# Patient Record
Sex: Female | Born: 1950
Health system: Southern US, Community
[De-identification: ages and names within clinical notes are randomized; demographics above are authoritative.]

## PROBLEM LIST (undated history)

## (undated) DIAGNOSIS — J189 Pneumonia, unspecified organism: Secondary | ICD-10-CM

## (undated) DIAGNOSIS — E041 Nontoxic single thyroid nodule: Secondary | ICD-10-CM

---

## 2014-07-14 DIAGNOSIS — Z6829 Body mass index (BMI) 29.0-29.9, adult: Secondary | ICD-10-CM | POA: Diagnosis not present

## 2014-07-14 DIAGNOSIS — Z1231 Encounter for screening mammogram for malignant neoplasm of breast: Secondary | ICD-10-CM | POA: Diagnosis not present

## 2014-07-14 DIAGNOSIS — F4320 Adjustment disorder, unspecified: Secondary | ICD-10-CM | POA: Diagnosis not present

## 2014-07-14 DIAGNOSIS — Z1211 Encounter for screening for malignant neoplasm of colon: Secondary | ICD-10-CM | POA: Diagnosis not present

## 2014-07-14 DIAGNOSIS — Z01411 Encounter for gynecological examination (general) (routine) with abnormal findings: Secondary | ICD-10-CM | POA: Diagnosis not present

## 2014-07-17 ENCOUNTER — Emergency Department (HOSPITAL_COMMUNITY)
Admission: EM | Admit: 2014-07-17 | Discharge: 2014-07-17 | Disposition: A | Discharge status: Home / Self Care | Payer: Medicare Other | Attending: Emergency Medicine | Admitting: Emergency Medicine

## 2014-07-17 ENCOUNTER — Emergency Department (HOSPITAL_COMMUNITY): Payer: Medicare Other

## 2014-07-17 ENCOUNTER — Encounter (HOSPITAL_COMMUNITY): Payer: Self-pay | Admitting: Emergency Medicine

## 2014-07-17 ENCOUNTER — Ambulatory Visit (INDEPENDENT_AMBULATORY_CARE_PROVIDER_SITE_OTHER): Payer: Medicare Other | Admitting: Family Medicine

## 2014-07-17 VITALS — BP 138/78 | HR 58 | Temp 97.9°F | Resp 20 | Ht 64.0 in | Wt 168.0 lb

## 2014-07-17 DIAGNOSIS — R061 Stridor: Secondary | ICD-10-CM | POA: Diagnosis not present

## 2014-07-17 DIAGNOSIS — R059 Cough, unspecified: Secondary | ICD-10-CM

## 2014-07-17 DIAGNOSIS — Z79899 Other long term (current) drug therapy: Secondary | ICD-10-CM | POA: Diagnosis not present

## 2014-07-17 DIAGNOSIS — R05 Cough: Secondary | ICD-10-CM

## 2014-07-17 DIAGNOSIS — E042 Nontoxic multinodular goiter: Secondary | ICD-10-CM | POA: Diagnosis not present

## 2014-07-17 DIAGNOSIS — R062 Wheezing: Secondary | ICD-10-CM | POA: Diagnosis not present

## 2014-07-17 DIAGNOSIS — R0602 Shortness of breath: Secondary | ICD-10-CM | POA: Diagnosis not present

## 2014-07-17 LAB — CBC WITH DIFFERENTIAL/PLATELET
BASOS PCT: 0 % (ref 0–1)
Basophils Absolute: 0 10*3/uL (ref 0.0–0.1)
Eosinophils Absolute: 0.1 10*3/uL (ref 0.0–0.7)
Eosinophils Relative: 2 % (ref 0–5)
HCT: 43.9 % (ref 36.0–46.0)
Hemoglobin: 15 g/dL (ref 12.0–15.0)
Lymphocytes Relative: 26 % (ref 12–46)
Lymphs Abs: 1.8 10*3/uL (ref 0.7–4.0)
MCH: 30.3 pg (ref 26.0–34.0)
MCHC: 34.2 g/dL (ref 30.0–36.0)
MCV: 88.7 fL (ref 78.0–100.0)
MONO ABS: 0.5 10*3/uL (ref 0.1–1.0)
Monocytes Relative: 7 % (ref 3–12)
NEUTROS ABS: 4.5 10*3/uL (ref 1.7–7.7)
Neutrophils Relative %: 65 % (ref 43–77)
Platelets: 170 10*3/uL (ref 150–400)
RBC: 4.95 MIL/uL (ref 3.87–5.11)
RDW: 12.9 % (ref 11.5–15.5)
WBC: 6.9 10*3/uL (ref 4.0–10.5)

## 2014-07-17 LAB — COMPREHENSIVE METABOLIC PANEL
ALBUMIN: 4.1 g/dL (ref 3.5–5.0)
ALT: 32 U/L (ref 14–54)
AST: 25 U/L (ref 15–41)
Alkaline Phosphatase: 77 U/L (ref 38–126)
Anion gap: 8 (ref 5–15)
BUN: 15 mg/dL (ref 6–20)
CALCIUM: 9.5 mg/dL (ref 8.9–10.3)
CHLORIDE: 106 mmol/L (ref 101–111)
CO2: 27 mmol/L (ref 22–32)
CREATININE: 0.92 mg/dL (ref 0.44–1.00)
GFR calc Af Amer: >60 mL/min (ref >60)
GFR calc non Af Amer: >60 mL/min (ref >60)
GLUCOSE: 104 mg/dL — AB (ref 65–99)
Potassium: 3.6 mmol/L (ref 3.5–5.1)
Sodium: 141 mmol/L (ref 135–145)
Total Bilirubin: 0.5 mg/dL (ref 0.3–1.2)
Total Protein: 6.9 g/dL (ref 6.5–8.1)

## 2014-07-17 LAB — TSH: TSH: 1.507 u[IU]/mL (ref 0.350–4.500)

## 2014-07-17 LAB — T4, FREE: FREE T4: 0.95 ng/dL (ref 0.61–1.12)

## 2014-07-17 MED ORDER — IOHEXOL 300 MG/ML  SOLN
75.0000 mL | Freq: Once | INTRAMUSCULAR | Status: AC | PRN
  Administered 2014-07-17: 75 mL via INTRAVENOUS

## 2014-07-17 NOTE — ED Notes (Signed)
Pt via [facility] from [facility] with c/o cough starting last night, midnight SOB and wheezing, 3 am woke up feeling congested and took a shower.  Pt feels like coughing increased throat tightness.  Auditory wheezing noted, lungs clear.  Denies CP, fever, N/V/D.  Pt reports the same approx 3 weeks ago that resolved quickly and was mild.  Pt alert and oriented.

## 2014-07-17 NOTE — Progress Notes (Signed)
Subjective:  This chart was scribed for Charlene Ray, MD by [facility], medical scribe at [facility].The patient was seen in exam room 11 and the patient's care was started at 2:34 PM.   Patient ID: Charlene Jacobs, female    DOB: 1950/07/28, 64 y.o.   MRN: [id number] Chief Complaint  Patient presents with  . Cough    X yesterday  . Wheezing    X last night  . SOB    X last night   HPI HPI Comments: Charlene Jacobs is a 64 y.o. female who presents to [facility] complaining of a shortness of breath, onset last night around 2:00 AM. She has a tightness in her throat but not chest. Symptoms worsen with activity. When she woke up this morning she took a shower did help, however symptoms have worsened in the past two to three hours. Yesterday she had a cough and sore throat all day, but this has improved. This is the third episode in the last couple months, the previous two episodes cleared within a few days. She has used her husbands antibiotics during previous episodes. She denies fever, chest pain, lightheadedness, wheezing, and dizziness.  There are no active problems to display for this patient.  History reviewed. No pertinent past medical history. History reviewed. No pertinent past surgical history. No Known Allergies Prior to Admission medications   Not on File   History   Social History  . Marital Status: Married    Spouse Name: N/A  . Number of Children: N/A  . Years of Education: N/A   Occupational History  . Not on file.   Social History Main Topics  . Smoking status: Never Smoker   . Smokeless tobacco: Not on file  . Alcohol Use: 0.0 oz/week    0 Standard drinks or equivalent per week  . Drug Use: No  . Sexual Activity: Not on file   Other Topics Concern  . Not on file   Social History Narrative  . No narrative on file   Review of Systems  Constitutional: Negative for fever.  HENT: Positive for sore  throat.   Respiratory: Positive for cough, shortness of breath and stridor. Negative for wheezing.   Cardiovascular: Negative for chest pain.  Neurological: Negative for dizziness and light-headedness.      Objective:  BP 138/78 mmHg  Pulse 58  Temp(Src) 97.9 F (36.6 C) (Oral)  Resp 20  Ht 5\' 4"  (1.626 m)  Wt 168 lb (76.204 kg)  BMI 28.82 kg/m2  SpO2 98% Physical Exam  Constitutional: She is oriented to person, place, and time. She appears well-developed and well-nourished. No distress.  HENT:  Head: Normocephalic and atraumatic.  Eyes: Pupils are equal, round, and reactive to light.  Neck: Normal range of motion.  Cardiovascular: Normal rate and regular rhythm.   Pulmonary/Chest: Effort normal. No respiratory distress.  Inspiratory and expiratory stridor that intermittently clears. Worse with activity when moving from room 11 to 7  Musculoskeletal: Normal range of motion.  Neurological: She is alert and oriented to person, place, and time.  Skin: Skin is warm and dry.  Psychiatric: She has a normal mood and affect. Her behavior is normal.  Nursing note and vitals reviewed. 2:56 PM report given to EMS, transfer of care and charge nurse was advised.     Assessment & Plan:   [name] is a 64 y.o. female Stridor  Cough  Acute onset  of stridor today, initially some improvement with warm shower, then recurrence and reported worsening past few hours.  stridorous with shortened sentences on initial exam. Concerning for central airway obstruction. reported similar sx's twice before in past 2 months, but has not been evaluated prior. Currently alert and oriented, comfortable appearing, not tachypneic at rest, and O2 sat ok.   -O2NC at 2 liters. EMS called for transport for further eval in ER, charge nurse advised.   No orders of the defined types were placed in this encounter.   Patient Instructions  If follow up needed after emergency room, you can return here for  follow up.

## 2014-07-17 NOTE — Patient Instructions (Signed)
If follow up needed after emergency room, you can return here for follow up.

## 2014-07-17 NOTE — ED Provider Notes (Signed)
CSN: [id number]     Arrival date & time 07/17/14  1522 History   First MD Initiated Contact with Patient 07/17/14 1525     Chief Complaint  Patient presents with  . Shortness of Breath  . Wheezing     (Consider location/radiation/quality/duration/timing/severity/associated sxs/prior Treatment) HPI  History reviewed. No pertinent past medical history. History reviewed. No pertinent past surgical history. Family History  Problem Relation Age of Onset  . Heart disease Mother   . Stroke Mother   . Cancer Brother    History  Substance Use Topics  . Smoking status: Never Smoker   . Smokeless tobacco: Never Used  . Alcohol Use: 1.2 oz/week    2 Glasses of wine per week   OB History    No data available     Review of Systems    Allergies  Review of patient's allergies indicates no known allergies.  Home Medications   Prior to Admission medications   Medication Sig Start Date End Date Taking? Authorizing Provider  b complex vitamins tablet Take 1 tablet by mouth once a week.   Yes Historical Provider, MD  ibuprofen (ADVIL,MOTRIN) 200 MG tablet Take 200 mg by mouth daily as needed (joint pain).   Yes Historical Provider, MD   BP 153/77 mmHg  Pulse 63  Temp(Src) 97.9 F (36.6 C) (Oral)  Resp 14  SpO2 100% Physical Exam  ED Course  Procedures (including critical care time) Labs Review Labs Reviewed  COMPREHENSIVE METABOLIC PANEL - Abnormal; Notable for the following:    Glucose, Bld 104 (*)    All other components within normal limits  CBC WITH DIFFERENTIAL/PLATELET  TSH  T4, FREE    Imaging Review Dg Chest 2 View  07/17/2014   CLINICAL DATA:  Shortness of breath.  Wheezing.  EXAM: CHEST  2 VIEW  COMPARISON:  None.  FINDINGS: The heart size and mediastinal contours are within normal limits. Both lungs are clear. The visualized skeletal structures are unremarkable.  IMPRESSION: No active cardiopulmonary disease.   Electronically Signed   By: Van Clines  M.D.   On: 07/17/2014 16:38   Ct Soft Tissue Neck W Contrast  07/17/2014   CLINICAL DATA:  Cough. Throat tightness. Left neck palpable nodule.  EXAM: CT NECK WITH CONTRAST  TECHNIQUE: Multidetector CT imaging of the neck was performed using the standard protocol following the bolus administration of intravenous contrast.  CONTRAST:  66mL OMNIPAQUE IOHEXOL 300 MG/ML  SOLN  COMPARISON:  None.  FINDINGS: Epiglottis, aryepiglottic folds, and glottic structures normal. Small hypodense nodules are present in the right thyroid lobe and the left thyroid lobe is diffusely enlarged and questionably hyperdense.  No pathologic adenopathy in the neck is identified. There is chronic left maxillary sinusitis and some frothy fluid in the left sphenoid sinus.  Parapharyngeal spaces appear normal. Mild biapical pleural parenchymal scarring.  No significant tonsillar enlargement.  Mild facet arthropathy on the right at C3-4. Calcified disc bulge at C5-6 without definite impingement.  IMPRESSION: 1. Small hypodense nodules in the right thyroid gland, with generalized enlargement of the left thyroid lobe and possibly a indistinct high density lesion anteriorly in the left thyroid lobe. Consider further evaluation with thyroid ultrasound (can be performed in the non-emergent setting). If patient is clinically hyperthyroid, consider nuclear medicine thyroid uptake and scan. 2. Mild acute left sphenoid sinusitis. Chronic left maxillary sinusitis. 3. Minimal cervical spondylosis and degenerative disc disease.   Electronically Signed   By: Cindra Eves.D.  On: 07/17/2014 18:28     EKG Interpretation   Date/Time:  Sunday July 17 2014 15:27:03 EDT Ventricular Rate:  63 PR Interval:  171 QRS Duration: 74 QT Interval:  418 QTC Calculation: 428 R Axis:   32 Text Interpretation:  Sinus rhythm Low voltage, precordial leads No  previous tracing Confirmed by Ashok Cordia  MD, Lennette Bihari ([id number]) on 07/17/2014  3:40:26 PM      MDM    64 year old female in no pertinent past medical history reports 2 weeks of cough and 1 day of trouble breathing with throat tightness. Rest of the history and exam as above. On presentation patient audibly wheezing with mild stridor. Mass palpable in the left throat. No evidence of PTA, strep throat. Chest x-ray unremarkable. CT of the neck revealed right thyroid nodule with no evidence to pinpoint the source of her stridor. Labs unremarkable. On reevaluation the patient's symptoms had completely resolved. She was satting well and normal work of breathing. She is in good condition is stable for discharge with strict return precautions. She is to follow-up with her primary care provider for further workup of the thyroid nodule.  She was seen in conjunction with Dr. Ashok Cordia.  Final diagnoses:  Wheezing  Stridor        Addison Lank, MD 07/18/14 [place], MD 07/18/14 1122

## 2014-07-17 NOTE — Discharge Instructions (Signed)
Please follow-up with your primary care provider for further workup of CT findings: Small hypodense nodules in the right thyroid gland, with generalized enlargement of the left thyroid lobe and possibly a indistinct high density lesion anteriorly in the left thyroid lobe. Consider further evaluation with thyroid ultrasound (can be performed in the non-emergent setting). If patient is clinically hyperthyroid, consider nuclear medicine thyroid uptake and scan.  Stridor Stridor is an abnormal, usually high-pitched sound made while breathing. It is the result of an airway that is partly blocked. Stridor occurs more often in children than in adults because children have smaller airways. Many different things can cause stridor. It might be an infection, a tumor, something stuck in the breathing passage, or part of a developmental problem of the airways. It is important that the symptoms be checked out promptly, especially in young children. CAUSES  Stridor can develop from an acute problem and come on quickly in children. This is often because:  Something gets stuck in the child's throat, nose or airways. The stuck item could be anything, but might be a piece of food or a coin.  The child develops croup. This is a breathing problem with a cough that sounds like a dog's bark. It results from swelling around the vocal cords. Croup is usually caused by a virus.  The child develops swollen tonsils or adenoids (tonsillitis).  The child develops a swollen area filled with pus on the tonsils (abscess).  The child has an allergic reaction. This could be to something that was breathed in, swallowed or injected.  The child had their airway evaluated by instruments or had a tube in their airway.  The child develops epiglottitis. This is an emergency condition. This occurs when the epiglottis (a small piece of tissue that covers the windpipe when you swallow and keeps food from going into the lungs) becomes  inflamed (the body's way or reacting to injury or infection). Different things can cause the inflammation, including:  Infection (this is the usual cause).  Injury (swallowing chemicals, for example). Stridor also can develop from a longtime (chronic) problem. Possibilities include:  Laryngomalacia. This occurs when floppy tissue above the vocal cords collapses into the airway when the child breathes in.  Subglottic stenosis. This is a narrowing of the airway just below the vocal cords.  Tracheomalacia. This occurs when the cartilage that keeps the airway open is weak. The cartilage is weak and floppy causing the airway to collapse in. This can also occur when there is something compressing the airway or something damages the cartilage causing it to become weak.  Vocal cord paralysis. This may result from trauma or brain abnormality. For instance, the vocal cords might have been injured during earlier surgery.  An injury to the voice box.  A tumor. DIAGNOSIS  In an emergency:   If something is stuck in a child's airway, the Heimlich maneuver might be used to force the item out of the windpipe.  If something is blocking the airway, an artificial airway may need to be placed for relief of the obstruction.  An operation may needed. If the child is not in immediate danger:  The child will be given a thorough exam. Usually, the child's temperature, pulse, breathing rate and oxygen levels will be checked. The healthcare provider will listen to the child's lungs through a stethoscope. The child's throat will be checked.  The healthcare provider will check for swelling in the child's neck or face area.  The healthcare provider will  ask about the child's medical history. This will include questions about the abnormal breathing sound. They may ask when the abnormal breathing started and what did it sound like.  The healthcare provider may also order some tests. These could include:  Blood  tests. The blood can give clues to the child's overall health. It also can show signs of infection. And, a blood test can show how much oxygen the child is getting.  Pulse oximetry . A device is put on the child's fingertip to measure oxygen levels in the blood.  Bronchoscopy . A flexible tube with a camera and a light is used to evaluate the airways. The child probably will be given medication to numb pain and help the child relax for the test. If given general anesthesia, the child will be asleep for the procedure. A local anesthetic would numb the area of the body, but the child would be awake. A sedative will help the child relax.  CT (computed tomography) scan. This scan provides a detailed picture inside the body.  Laryngoscopy . A small, lighted tube is used to check the area around voice box. This is usually done without sedation while the patient is awake  X-ray of the chest or neck. This can sometimes locate something stuck in the airway or show swelling in the airway. TREATMENT  In the short term:  If something is stuck in a child's airway, the Heimlich maneuver might be used to force the item out of the windpipe.  If nothing is stuck but the child has serious trouble breathing, an artificial airway or an operation to create an airway may be needed In the longer term, stridor is treated by treating whatever is causing it:   If a growth or tumor is causing the obstruction, surgery may be recommended to remove it.  Antibiotics may be prescribed to treat an infection. HOME CARE INSTRUCTIONS  What care the child will need at home will depend on what caused the stridor and how it was treated. In general:  Ask the child's healthcare provider if there is anything the child should or should not do while recovering.  Make sure the child takes any medications that were prescribed. Follow the directions carefully. The child should take all of the medicine, unless the healthcare provider  has given different instructions.  Encourage the child to eat slowly. Careful eating can help prevent food from being inhaled accidentally. SEEK MEDICAL CARE IF:   The child develops a fever above 100.5 F (38.1 C). SEEK IMMEDIATE MEDICAL CARE IF:   The child has trouble breathing again.  Other symptoms return.  The child develops a fever above 102.0 F (38.9 C). Document Released: 11/04/2008 Document Revised: 04/01/2011 Document Reviewed: 11/04/2008 [facility] Patient Information 2015 [place], [place]. This information is not intended to replace advice given to you by your health care provider. Make sure you discuss any questions you have with your health care provider.

## 2014-07-27 DIAGNOSIS — E041 Nontoxic single thyroid nodule: Secondary | ICD-10-CM | POA: Diagnosis not present

## 2014-07-27 DIAGNOSIS — J069 Acute upper respiratory infection, unspecified: Secondary | ICD-10-CM | POA: Diagnosis not present

## 2014-07-27 DIAGNOSIS — H6120 Impacted cerumen, unspecified ear: Secondary | ICD-10-CM | POA: Diagnosis not present

## 2014-07-28 ENCOUNTER — Other Ambulatory Visit: Payer: Self-pay | Admitting: Internal Medicine

## 2014-07-28 DIAGNOSIS — E041 Nontoxic single thyroid nodule: Secondary | ICD-10-CM

## 2014-08-01 ENCOUNTER — Ambulatory Visit
Admission: RE | Admit: 2014-08-01 | Discharge: 2014-08-01 | Disposition: A | Discharge status: Home / Self Care | Payer: Medicare Other | Source: Ambulatory Visit | Attending: Internal Medicine | Admitting: Internal Medicine

## 2014-08-01 DIAGNOSIS — E042 Nontoxic multinodular goiter: Secondary | ICD-10-CM | POA: Diagnosis not present

## 2014-08-01 DIAGNOSIS — E041 Nontoxic single thyroid nodule: Secondary | ICD-10-CM

## 2014-08-04 ENCOUNTER — Other Ambulatory Visit: Payer: Self-pay | Admitting: Internal Medicine

## 2014-08-04 DIAGNOSIS — E041 Nontoxic single thyroid nodule: Secondary | ICD-10-CM

## 2014-08-24 ENCOUNTER — Ambulatory Visit
Admission: RE | Admit: 2014-08-24 | Discharge: 2014-08-24 | Disposition: A | Discharge status: Home / Self Care | Payer: Medicare Other | Source: Ambulatory Visit | Attending: Internal Medicine | Admitting: Internal Medicine

## 2014-08-24 ENCOUNTER — Other Ambulatory Visit (HOSPITAL_COMMUNITY)
Admission: RE | Admit: 2014-08-24 | Discharge: 2014-08-24 | Disposition: A | Discharge status: Home / Self Care | Payer: Medicare Other | Source: Ambulatory Visit | Attending: Interventional Radiology | Admitting: Interventional Radiology

## 2014-08-24 DIAGNOSIS — E041 Nontoxic single thyroid nodule: Secondary | ICD-10-CM | POA: Diagnosis not present

## 2014-09-05 ENCOUNTER — Ambulatory Visit: Payer: Self-pay | Admitting: Surgery

## 2014-09-05 DIAGNOSIS — E041 Nontoxic single thyroid nodule: Secondary | ICD-10-CM | POA: Diagnosis not present

## 2014-09-05 NOTE — H&P (Signed)
Charlene Jacobs 09/05/2014 9:26 AM Location: [facility] Patient #: [id number] DOB: 1950/06/17 Married / Language: English / Race: White Female History of Present Illness (Charlene Valent A. Solange Emry MD; 09/05/2014 12:09 PM) Patient words: thyroid   Pt sent at the request of Dr Thressa Sheller for mass in patients left neck for 4 months. Pt noted some swelling and a lump 4 months ago left side of her neck. Had a URI and stridor an couple of months ago which prompted work up with U/S and biopsy of a 3 cm left thyroid lobe mass. FNA showed a suspicious follicular lesion. Pt has no hoarseness, dysphagia or pain in neck. Denies wt gain or loss, heat or cold intolerance or excessive irritability. thyroid function normal.     CLINICAL DATA: Dominant left thyroid hypoechoic mass EXAM: ULTRASOUND GUIDED NEEDLE ASPIRATE BIOPSY OF THE THYROID GLAND COMPARISON: 08/01/2014 PROCEDURE: Thyroid biopsy was thoroughly discussed with the patient and questions were answered. The benefits, risks, alternatives, and complications were also discussed. The patient understands and wishes to proceed with the procedure. Written consent was obtained. Ultrasound was performed to localize and mark an adequate site for the biopsy. The patient was then prepped and draped in a normal sterile fashion. Local anesthesia was provided with 1% lidocaine. Using direct ultrasound guidance, 4 passes were made using needles into the nodule within the left lobe of the thyroid. Ultrasound was used to confirm needle placements on all occasions. Specimens were sent to Pathology for analysis. Complications: None immediate FINDINGS: Imaging confirms needle placed in the dominant left thyroid mass IMPRESSION: Ultrasound guided needle aspirate biopsy performed of the dominant left thyroid mass. Electronically Signed By: Jerilynn Mages. Shick M.D. On: 08/24/2014 13:05       CLINICAL DATA: Thyroid nodules detected by CT of the  neck.  EXAM: THYROID ULTRASOUND  TECHNIQUE: Ultrasound examination of the thyroid gland and adjacent soft tissues was performed.  COMPARISON: CT of the neck on 07/17/2014  FINDINGS: Right thyroid lobe  Measurements: 5.0 x 1.1 x 2.5 cm. Multiple small subcentimeter nodules are identified in the right lobe. The largest solid nodule measures approximately 1.1 x 0.6 x 1.0 cm. The largest mostly cystic nodule measures 0.7 cm.  Left thyroid lobe  Measurements: 4.5 x 1.5 x 2.3 cm. Ovoid solid nodule in the mid left lobe measures approximately 3.0 x 1.0 x 2.5 cm. This nodule is noncalcified. Tiny subcentimeter scattered nodules are seen elsewhere in the left lobe.  Isthmus  Thickness: 0.3 cm. No nodules visualized.  Lymphadenopathy  None visualized.  IMPRESSION: Multinodular thyroid goiter with dominant left thyroid nodule. This measures approximately 3.0 x 1.0 x 2.5 cm and meets size criteria for biopsy.  Findings meet consensus criteria for biopsy. Ultrasound-guided fine needle aspiration should be considered, as per the consensus statement: Management of Thyroid Nodules Detected at [place]: Society of Radiologists in [place]. Radiology 2005; [id number].   Electronically Signed By: Aletta Edouard M.D. On: 08/01/2014 10:14                    Diagnosis THYROID, LEFT MIDDLE POLE, FINE NEEDLE ASPIRATION (SPECIMEN 1 OF 1 COLLECTED 08/24/2014) FINDINGS SUSPICIOUS FOR A FOLLICULAR LESION / NEOPLASM (BETHESDA CATEGORY IV). SEE COMMENT. Willeen Niece MD Pathologist, Electronic Signature (Case signed 08/25/2014).  The patient is a 64 year old female   Other Problems Marjean Donna, [place]; 09/05/2014 9:27 AM) Back Pain Bladder Problems Hemorrhoids  Past Surgical History Marjean Donna, CMA; 09/05/2014 9:27 AM) No pertinent past  surgical history  Diagnostic Studies History Marjean Donna, CMA; 09/05/2014 9:27  AM) Colonoscopy never Mammogram within last year Pap Smear 1-5 years ago  Allergies Marjean Donna, CMA; 09/05/2014 9:28 AM) No Known Drug Allergies 09/05/2014  Medication History (Sonya Bynum, CMA; 09/05/2014 9:28 AM) No Current Medications Medications Reconciled  Social History Marjean Donna, CMA; 09/05/2014 9:27 AM) Alcohol use Occasional alcohol use. Caffeine use Coffee, Tea. No drug use Tobacco use Never smoker.  Family History Marjean Donna, [place]; 09/05/2014 9:27 AM) Alcohol Abuse Father, Mother. Arthritis Mother. Breast Cancer Daughter. Cancer Brother, Father. Cerebrovascular Accident Mother. Heart Disease Mother. Thyroid problems Father.  Pregnancy / Birth History Marjean Donna, [place]; 09/05/2014 9:27 AM) Age at menarche 52 years. Age of menopause 51-55 Contraceptive History Oral contraceptives. Gravida 5 Maternal age 62-20 Para 5     Review of Systems ([place]; 09/05/2014 9:27 AM) General Not Present- Appetite Loss, Chills, Fatigue, Fever, Night Sweats, Weight Gain and Weight Loss. Skin Present- New Lesions. Not Present- Change in Wart/Mole, Dryness, Hives, Jaundice, Non-Healing Wounds, Rash and Ulcer. HEENT Present- Hearing Loss, Hoarseness and Ringing in the Ears. Not Present- Earache, Nose Bleed, Oral Ulcers, Seasonal Allergies, Sinus Pain, Sore Throat, Visual Disturbances, Wears glasses/contact lenses and Yellow Eyes. Respiratory Not Present- Bloody sputum, Chronic Cough, Difficulty Breathing, Snoring and Wheezing. Breast Not Present- Breast Mass, Breast Pain, Nipple Discharge and Skin Changes. Cardiovascular Not Present- Chest Pain, Difficulty Breathing Lying Down, Leg Cramps, Palpitations, Rapid Heart Rate, Shortness of Breath and Swelling of Extremities. Gastrointestinal Present- Indigestion. Not Present- Abdominal Pain, Bloating, Bloody Stool, Change in Bowel Habits, Chronic diarrhea, Constipation, Difficulty Swallowing, Excessive gas,  Gets full quickly at meals, Hemorrhoids, Nausea, Rectal Pain and Vomiting. Female Genitourinary Present- Nocturia. Not Present- Frequency, Painful Urination, Pelvic Pain and Urgency. Musculoskeletal Present- Joint Pain and Joint Stiffness. Not Present- Back Pain, Muscle Pain, Muscle Weakness and Swelling of Extremities. Neurological Present- Tremor. Not Present- Decreased Memory, Fainting, Headaches, Numbness, Seizures, Tingling, Trouble walking and Weakness. Psychiatric Not Present- Anxiety, Bipolar, Change in Sleep Pattern, Depression, Fearful and Frequent crying. Endocrine Not Present- Cold Intolerance, Excessive Hunger, Hair Changes, Heat Intolerance, Hot flashes and New Diabetes. Hematology Not Present- Easy Bruising, Excessive bleeding, Gland problems, HIV and Persistent Infections.  Vitals (Sonya Bynum CMA; 09/05/2014 9:28 AM) 09/05/2014 9:27 AM Weight: 165 lb Height: 64in Body Surface Area: 1.84 m Body Mass Index: 28.32 kg/m Temp.: 93F(Temporal)  Pulse: 76 (Regular)  BP: 122/80 (Sitting, Left Arm, Standard)     Physical Exam (Lilla Callejo A. Berniece Abid MD; 09/05/2014 12:10 PM)  General Mental Status-Alert. General Appearance-Consistent with stated age. Hydration-Well hydrated. Voice-Normal.  Head and Neck Thyroid Gland Characteristics - enlarged. There is a palpable mass/nodule found and described as follows:: Size: Size - Approximate size: - Note: 3 cm mobile left thyroid lobe. There is a palpable mass/nodule found and described as follows: - The location of the mass/nodule is the - left mid-lobe. Other observations include - the mass/nodule is a dominant nodule, it is mobile and the mass/nodule moves on swallowing.  Chest and Lung Exam Chest and lung exam reveals -quiet, even and easy respiratory effort with no use of accessory muscles and on auscultation, normal breath sounds, no adventitious sounds and normal vocal resonance. Inspection Chest Wall -  Normal. Back - normal.  Cardiovascular Cardiovascular examination reveals -normal heart sounds, regular rate and rhythm with no murmurs and normal pedal pulses bilaterally.  Neurologic Neurologic evaluation reveals -alert and oriented x 3 with no impairment of recent or remote  memory. Mental Status-Normal.  Musculoskeletal Normal Exam - Left-Upper Extremity Strength Normal and Lower Extremity Strength Normal. Normal Exam - Right-Upper Extremity Strength Normal and Lower Extremity Strength Normal.  Lymphatic Head & Neck  General Head & Neck Lymphatics: Bilateral - Description - Normal.    Assessment & Plan (Darnelle Derrick A. Margarine Grosshans MD; 09/05/2014 12:14 PM)  THYROID NODULE (241.0  E04.1) Impression: discussed the need for surgery since lesion is suspicious and follicular histology. Iexplained it is difficult to differential benign from malignant adenoma with lobectomy. Discussed total thyroidectomy as well and the pro and cons to this approach and the avoidence of a second surgery if malignant. Discussed the risk of each and discussed the risks of bleeding, infection, nerve injury with loss of voice fction, breathing poroblems requiring a hole in the neck to breathe, blood vessel injury, esophagus injury, calcium problems , parathyroid gland problems and the need for other surgery, treatment, death, blood clots.  She agrees to proceed with total thyroidectomy after discussion of the above.  Current Plans Pt Education - Thyroid Diseases: discussed with patient and provided information. Pt Education - Patient information: Thyroid nodules (The Basics): discussed with patient and provided information. Pt Education - Patient information: Thyroid nodules (Beyond the Basics): discussed with patient and provided information. Pt Education - CCS Free Text Education/Instructions: discussed with patient and provided information.

## 2014-09-06 DIAGNOSIS — Z13820 Encounter for screening for osteoporosis: Secondary | ICD-10-CM | POA: Diagnosis not present

## 2014-09-07 DIAGNOSIS — Z0001 Encounter for general adult medical examination with abnormal findings: Secondary | ICD-10-CM | POA: Diagnosis not present

## 2014-09-07 DIAGNOSIS — E041 Nontoxic single thyroid nodule: Secondary | ICD-10-CM | POA: Diagnosis not present

## 2014-09-21 DIAGNOSIS — E041 Nontoxic single thyroid nodule: Secondary | ICD-10-CM | POA: Diagnosis not present

## 2014-09-27 DIAGNOSIS — E041 Nontoxic single thyroid nodule: Secondary | ICD-10-CM | POA: Diagnosis not present

## 2014-09-30 ENCOUNTER — Encounter (HOSPITAL_COMMUNITY)
Admission: RE | Admit: 2014-09-30 | Discharge: 2014-09-30 | Disposition: A | Discharge status: Home / Self Care | Payer: Medicare Other | Source: Ambulatory Visit | Attending: Surgery | Admitting: Surgery

## 2014-09-30 ENCOUNTER — Encounter (HOSPITAL_COMMUNITY): Payer: Self-pay

## 2014-09-30 ENCOUNTER — Other Ambulatory Visit (HOSPITAL_COMMUNITY): Payer: Self-pay

## 2014-09-30 DIAGNOSIS — E041 Nontoxic single thyroid nodule: Secondary | ICD-10-CM | POA: Diagnosis not present

## 2014-09-30 DIAGNOSIS — Z01818 Encounter for other preprocedural examination: Secondary | ICD-10-CM | POA: Diagnosis not present

## 2014-09-30 DIAGNOSIS — D34 Benign neoplasm of thyroid gland: Secondary | ICD-10-CM | POA: Diagnosis not present

## 2014-09-30 HISTORY — DX: Pneumonia, unspecified organism: J18.9

## 2014-09-30 LAB — COMPREHENSIVE METABOLIC PANEL
ALT: 29 U/L (ref 14–54)
ANION GAP: 8 (ref 5–15)
AST: 26 U/L (ref 15–41)
Albumin: 3.9 g/dL (ref 3.5–5.0)
Alkaline Phosphatase: 69 U/L (ref 38–126)
BILIRUBIN TOTAL: 0.6 mg/dL (ref 0.3–1.2)
BUN: 16 mg/dL (ref 6–20)
CALCIUM: 9.3 mg/dL (ref 8.9–10.3)
CO2: 25 mmol/L (ref 22–32)
Chloride: 104 mmol/L (ref 101–111)
Creatinine, Ser: 0.91 mg/dL (ref 0.44–1.00)
GFR calc Af Amer: >60 mL/min (ref >60)
Glucose, Bld: 99 mg/dL (ref 65–99)
POTASSIUM: 3.9 mmol/L (ref 3.5–5.1)
Sodium: 137 mmol/L (ref 135–145)
TOTAL PROTEIN: 6.7 g/dL (ref 6.5–8.1)

## 2014-09-30 LAB — CBC WITH DIFFERENTIAL/PLATELET
Basophils Absolute: 0 10*3/uL (ref 0.0–0.1)
Basophils Relative: 0 % (ref 0–1)
Eosinophils Absolute: 0.1 10*3/uL (ref 0.0–0.7)
Eosinophils Relative: 1 % (ref 0–5)
HEMATOCRIT: 42.1 % (ref 36.0–46.0)
Hemoglobin: 14.3 g/dL (ref 12.0–15.0)
LYMPHS PCT: 33 % (ref 12–46)
Lymphs Abs: 2 10*3/uL (ref 0.7–4.0)
MCH: 30.4 pg (ref 26.0–34.0)
MCHC: 34 g/dL (ref 30.0–36.0)
MCV: 89.4 fL (ref 78.0–100.0)
MONO ABS: 0.4 10*3/uL (ref 0.1–1.0)
MONOS PCT: 7 % (ref 3–12)
NEUTROS ABS: 3.7 10*3/uL (ref 1.7–7.7)
Neutrophils Relative %: 59 % (ref 43–77)
Platelets: 186 10*3/uL (ref 150–400)
RBC: 4.71 MIL/uL (ref 3.87–5.11)
RDW: 13 % (ref 11.5–15.5)
WBC: 6.2 10*3/uL (ref 4.0–10.5)

## 2014-09-30 MED ORDER — CHLORHEXIDINE GLUCONATE 4 % EX LIQD: 1.0000 "application " | Freq: Once | CUTANEOUS | Status: DC

## 2014-09-30 NOTE — Pre-Procedure Instructions (Signed)
    Charlene Jacobs  09/30/2014      [id number] [address] [place] [address] Phone: [phone] Fax: [phone]    Your procedure is scheduled on October 11, 2014.  Report to [facility] Admitting at 5:30 A.M.  Call this number if you have problems the morning of surgery:  [phone]   Remember:  Do not eat food or drink liquids after midnight.  Take these medicines the morning of surgery with A SIP OF WATER : NONE  STOP ASPIRIN, HERBAL SUPPLEMENTS, NSAIDS (ALEVE, ADVIL, IBUPROFEN) ONE WEEK PRIOR TO SURGERY   Do not wear jewelry, make-up or nail polish.  Do not wear lotions, powders, or perfumes.  You may wear deodorant.  Do not shave 48 hours prior to surgery.    Do not bring valuables to the hospital.  [facility] is not responsible for any belongings or valuables.  Contacts, dentures or bridgework may not be worn into surgery.  Leave your suitcase in the car.  After surgery it may be brought to your room.  For patients admitted to the hospital, discharge time will be determined by your treatment team.  Patients discharged the day of surgery will not be allowed to drive home.   Name and phone number of your driver:    Special instructions:  "PREPARING FOR SURGERY"  Please read over the following fact sheets that you were given. Pain Booklet, Coughing and Deep Breathing and Surgical Site Infection Prevention

## 2014-10-10 MED ORDER — DEXTROSE 5 % IV SOLN
3.0000 g | INTRAVENOUS | Status: AC
  Administered 2014-10-11: 2 g via INTRAVENOUS
  Filled 2014-10-10: qty 3000

## 2014-10-11 ENCOUNTER — Encounter (HOSPITAL_COMMUNITY): Payer: Self-pay | Admitting: General Practice

## 2014-10-11 ENCOUNTER — Ambulatory Visit (HOSPITAL_COMMUNITY): Payer: Medicare Other | Admitting: Anesthesiology

## 2014-10-11 ENCOUNTER — Encounter (HOSPITAL_COMMUNITY)
Admission: RE | Disposition: A | Discharge status: Home / Self Care | Payer: Self-pay | Source: Ambulatory Visit | Attending: Surgery

## 2014-10-11 ENCOUNTER — Observation Stay (HOSPITAL_COMMUNITY)
Admission: RE | Admit: 2014-10-11 | Discharge: 2014-10-12 | Disposition: A | Discharge status: Home / Self Care | Payer: Medicare Other | Source: Ambulatory Visit | Attending: Surgery | Admitting: Surgery

## 2014-10-11 ENCOUNTER — Ambulatory Visit (HOSPITAL_COMMUNITY): Payer: Medicare Other

## 2014-10-11 DIAGNOSIS — Z01811 Encounter for preprocedural respiratory examination: Secondary | ICD-10-CM

## 2014-10-11 DIAGNOSIS — D34 Benign neoplasm of thyroid gland: Secondary | ICD-10-CM | POA: Diagnosis not present

## 2014-10-11 DIAGNOSIS — E041 Nontoxic single thyroid nodule: Secondary | ICD-10-CM | POA: Diagnosis not present

## 2014-10-11 DIAGNOSIS — Z01818 Encounter for other preprocedural examination: Secondary | ICD-10-CM | POA: Diagnosis not present

## 2014-10-11 HISTORY — PX: THYROIDECTOMY: SHX17

## 2014-10-11 HISTORY — DX: Nontoxic single thyroid nodule: E04.1

## 2014-10-11 LAB — COMPREHENSIVE METABOLIC PANEL
ALT: 34 U/L (ref 14–54)
ANION GAP: 10 (ref 5–15)
AST: 31 U/L (ref 15–41)
Albumin: 3.8 g/dL (ref 3.5–5.0)
Alkaline Phosphatase: 68 U/L (ref 38–126)
BUN: 11 mg/dL (ref 6–20)
CALCIUM: 8.9 mg/dL (ref 8.9–10.3)
CHLORIDE: 100 mmol/L — AB (ref 101–111)
CO2: 27 mmol/L (ref 22–32)
CREATININE: 0.94 mg/dL (ref 0.44–1.00)
Glucose, Bld: 181 mg/dL — ABNORMAL HIGH (ref 65–99)
Potassium: 4 mmol/L (ref 3.5–5.1)
SODIUM: 137 mmol/L (ref 135–145)
Total Bilirubin: 0.7 mg/dL (ref 0.3–1.2)
Total Protein: 6.8 g/dL (ref 6.5–8.1)

## 2014-10-11 SURGERY — THYROIDECTOMY
Anesthesia: General | Site: Neck

## 2014-10-11 MED ORDER — ZOLPIDEM TARTRATE 5 MG PO TABS: 5.0000 mg | ORAL_TABLET | Freq: Every evening | ORAL | Status: DC | PRN

## 2014-10-11 MED ORDER — MIDAZOLAM HCL 5 MG/5ML IJ SOLN
INTRAMUSCULAR | Status: DC | PRN
  Administered 2014-10-11: 2 mg via INTRAVENOUS

## 2014-10-11 MED ORDER — ONDANSETRON HCL 4 MG/2ML IJ SOLN
INTRAMUSCULAR | Status: AC
  Filled 2014-10-11: qty 2

## 2014-10-11 MED ORDER — DEXAMETHASONE SODIUM PHOSPHATE 10 MG/ML IJ SOLN
INTRAMUSCULAR | Status: DC | PRN
  Administered 2014-10-11: 10 mg via INTRAVENOUS

## 2014-10-11 MED ORDER — PROMETHAZINE HCL 25 MG/ML IJ SOLN
6.2500 mg | INTRAMUSCULAR | Status: DC | PRN
Start: 2014-10-11 — End: 2014-10-11

## 2014-10-11 MED ORDER — ONDANSETRON HCL 4 MG/2ML IJ SOLN
INTRAMUSCULAR | Status: DC | PRN
  Administered 2014-10-11: 4 mg via INTRAVENOUS

## 2014-10-11 MED ORDER — ONDANSETRON HCL 4 MG/2ML IJ SOLN
4.0000 mg | Freq: Four times a day (QID) | INTRAMUSCULAR | Status: DC | PRN
  Administered 2014-10-11: 4 mg via INTRAVENOUS
  Filled 2014-10-11: qty 2

## 2014-10-11 MED ORDER — 0.9 % SODIUM CHLORIDE (POUR BTL) OPTIME
TOPICAL | Status: DC | PRN
  Administered 2014-10-11: 1000 mL

## 2014-10-11 MED ORDER — OXYCODONE HCL 5 MG PO TABS
5.0000 mg | ORAL_TABLET | ORAL | Status: DC | PRN
  Administered 2014-10-11 – 2014-10-12 (×4): 10 mg via ORAL
  Filled 2014-10-11 (×4): qty 2

## 2014-10-11 MED ORDER — NEOSTIGMINE METHYLSULFATE 10 MG/10ML IV SOLN
INTRAVENOUS | Status: DC | PRN
  Administered 2014-10-11: 3 mg via INTRAVENOUS

## 2014-10-11 MED ORDER — DEXAMETHASONE SODIUM PHOSPHATE 10 MG/ML IJ SOLN
INTRAMUSCULAR | Status: AC
  Filled 2014-10-11: qty 1

## 2014-10-11 MED ORDER — DIPHENHYDRAMINE HCL 50 MG/ML IJ SOLN: 12.5000 mg | Freq: Four times a day (QID) | INTRAMUSCULAR | Status: DC | PRN

## 2014-10-11 MED ORDER — FENTANYL CITRATE (PF) 250 MCG/5ML IJ SOLN
INTRAMUSCULAR | Status: AC
  Filled 2014-10-11: qty 5

## 2014-10-11 MED ORDER — LACTATED RINGERS IV SOLN
INTRAVENOUS | Status: DC | PRN
  Administered 2014-10-11 (×2): via INTRAVENOUS

## 2014-10-11 MED ORDER — ROCURONIUM BROMIDE 50 MG/5ML IV SOLN
INTRAVENOUS | Status: AC
  Filled 2014-10-11: qty 1

## 2014-10-11 MED ORDER — GLYCOPYRROLATE 0.2 MG/ML IJ SOLN
INTRAMUSCULAR | Status: AC
  Filled 2014-10-11: qty 1

## 2014-10-11 MED ORDER — PROPOFOL 10 MG/ML IV BOLUS
INTRAVENOUS | Status: AC
  Filled 2014-10-11: qty 20

## 2014-10-11 MED ORDER — HYDROMORPHONE HCL 1 MG/ML IJ SOLN: 1.0000 mg | INTRAMUSCULAR | Status: DC | PRN

## 2014-10-11 MED ORDER — SUCCINYLCHOLINE CHLORIDE 20 MG/ML IJ SOLN
INTRAMUSCULAR | Status: AC
  Filled 2014-10-11: qty 1

## 2014-10-11 MED ORDER — FENTANYL CITRATE (PF) 100 MCG/2ML IJ SOLN
INTRAMUSCULAR | Status: DC | PRN
  Administered 2014-10-11 (×3): 50 ug via INTRAVENOUS

## 2014-10-11 MED ORDER — HYDRALAZINE HCL 20 MG/ML IJ SOLN: 10.0000 mg | INTRAMUSCULAR | Status: DC | PRN

## 2014-10-11 MED ORDER — EPHEDRINE SULFATE 50 MG/ML IJ SOLN
INTRAMUSCULAR | Status: AC
  Filled 2014-10-11: qty 1

## 2014-10-11 MED ORDER — LIDOCAINE HCL (CARDIAC) 20 MG/ML IV SOLN
INTRAVENOUS | Status: AC
  Filled 2014-10-11: qty 10

## 2014-10-11 MED ORDER — LIDOCAINE HCL (CARDIAC) 20 MG/ML IV SOLN
INTRAVENOUS | Status: DC | PRN
  Administered 2014-10-11: 60 mg via INTRAVENOUS

## 2014-10-11 MED ORDER — ROCURONIUM BROMIDE 100 MG/10ML IV SOLN
INTRAVENOUS | Status: DC | PRN
  Administered 2014-10-11: 40 mg via INTRAVENOUS

## 2014-10-11 MED ORDER — PROPOFOL 10 MG/ML IV BOLUS
INTRAVENOUS | Status: DC | PRN
  Administered 2014-10-11: 90 mg via INTRAVENOUS

## 2014-10-11 MED ORDER — GLYCOPYRROLATE 0.2 MG/ML IJ SOLN
INTRAMUSCULAR | Status: DC | PRN
  Administered 2014-10-11: 0.4 mg via INTRAVENOUS

## 2014-10-11 MED ORDER — GLYCOPYRROLATE 0.2 MG/ML IJ SOLN
INTRAMUSCULAR | Status: AC
  Filled 2014-10-11: qty 2

## 2014-10-11 MED ORDER — LIDOCAINE HCL 4 % MT SOLN
OROMUCOSAL | Status: DC | PRN
Start: 2014-10-11 — End: 2014-10-11
  Administered 2014-10-11: 4 mL via TOPICAL

## 2014-10-11 MED ORDER — DIPHENHYDRAMINE HCL 12.5 MG/5ML PO ELIX: 12.5000 mg | ORAL_SOLUTION | Freq: Four times a day (QID) | ORAL | Status: DC | PRN

## 2014-10-11 MED ORDER — ENOXAPARIN SODIUM 40 MG/0.4ML ~~LOC~~ SOLN
40.0000 mg | SUBCUTANEOUS | Status: DC
  Filled 2014-10-11: qty 0.4

## 2014-10-11 MED ORDER — MIDAZOLAM HCL 2 MG/2ML IJ SOLN
INTRAMUSCULAR | Status: AC
  Filled 2014-10-11: qty 4

## 2014-10-11 MED ORDER — STERILE WATER FOR INJECTION IJ SOLN
INTRAMUSCULAR | Status: AC
  Filled 2014-10-11: qty 10

## 2014-10-11 MED ORDER — ONDANSETRON 4 MG PO TBDP: 4.0000 mg | ORAL_TABLET | Freq: Four times a day (QID) | ORAL | Status: DC | PRN

## 2014-10-11 MED ORDER — DEXTROSE-NACL 5-0.9 % IV SOLN
INTRAVENOUS | Status: DC
  Administered 2014-10-11 – 2014-10-12 (×2): via INTRAVENOUS

## 2014-10-11 MED ORDER — HYDROMORPHONE HCL 1 MG/ML IJ SOLN: 0.2500 mg | INTRAMUSCULAR | Status: DC | PRN

## 2014-10-11 MED ORDER — SIMETHICONE 80 MG PO CHEW: 40.0000 mg | CHEWABLE_TABLET | Freq: Four times a day (QID) | ORAL | Status: DC | PRN

## 2014-10-11 SURGICAL SUPPLY — 50 items
BLADE SURG ROTATE 9660 (MISCELLANEOUS) IMPLANT
CANISTER SUCTION 2500CC (MISCELLANEOUS) ×2 IMPLANT
CHLORAPREP W/TINT 10.5 ML (MISCELLANEOUS) ×2 IMPLANT
CLIP TI MEDIUM 24 (CLIP) ×2 IMPLANT
CLIP TI WIDE RED SMALL 24 (CLIP) ×2 IMPLANT
CONT SPEC 4OZ CLIKSEAL STRL BL (MISCELLANEOUS) IMPLANT
COVER SURGICAL LIGHT HANDLE (MISCELLANEOUS) ×2 IMPLANT
CRADLE DONUT ADULT HEAD (MISCELLANEOUS) ×2 IMPLANT
DRAPE PED LAPAROTOMY (DRAPES) ×2 IMPLANT
DRAPE UTILITY XL STRL (DRAPES) ×4 IMPLANT
ELECT CAUTERY BLADE 6.4 (BLADE) ×2 IMPLANT
ELECT REM PT RETURN 9FT ADLT (ELECTROSURGICAL) ×2
ELECTRODE REM PT RTRN 9FT ADLT (ELECTROSURGICAL) ×1 IMPLANT
GAUZE SPONGE 4X4 16PLY XRAY LF (GAUZE/BANDAGES/DRESSINGS) ×4 IMPLANT
GLOVE BIO SURGEON STRL SZ7 (GLOVE) ×2 IMPLANT
GLOVE BIO SURGEON STRL SZ8 (GLOVE) ×4 IMPLANT
GLOVE BIOGEL PI IND STRL 6.5 (GLOVE) ×1 IMPLANT
GLOVE BIOGEL PI IND STRL 7.5 (GLOVE) ×1 IMPLANT
GLOVE BIOGEL PI IND STRL 8 (GLOVE) ×1 IMPLANT
GLOVE BIOGEL PI IND STRL 8.5 (GLOVE) ×1 IMPLANT
GLOVE BIOGEL PI INDICATOR 6.5 (GLOVE) ×1
GLOVE BIOGEL PI INDICATOR 7.5 (GLOVE) ×1
GLOVE BIOGEL PI INDICATOR 8 (GLOVE) ×1
GLOVE BIOGEL PI INDICATOR 8.5 (GLOVE) ×1
GOWN STRL REUS W/ TWL LRG LVL3 (GOWN DISPOSABLE) ×3 IMPLANT
GOWN STRL REUS W/ TWL XL LVL3 (GOWN DISPOSABLE) ×1 IMPLANT
GOWN STRL REUS W/TWL LRG LVL3 (GOWN DISPOSABLE) ×3
GOWN STRL REUS W/TWL XL LVL3 (GOWN DISPOSABLE) ×1
HEMOSTAT SNOW SURGICEL 2X4 (HEMOSTASIS) ×2 IMPLANT
KIT BASIN OR (CUSTOM PROCEDURE TRAY) ×2 IMPLANT
KIT ROOM TURNOVER OR (KITS) ×2 IMPLANT
LIQUID BAND (GAUZE/BANDAGES/DRESSINGS) ×2 IMPLANT
NS IRRIG 1000ML POUR BTL (IV SOLUTION) ×2 IMPLANT
PACK SURGICAL SETUP 50X90 (CUSTOM PROCEDURE TRAY) ×2 IMPLANT
PAD ARMBOARD 7.5X6 YLW CONV (MISCELLANEOUS) ×4 IMPLANT
PENCIL BUTTON HOLSTER BLD 10FT (ELECTRODE) ×2 IMPLANT
SHEARS HARMONIC 9CM CVD (BLADE) ×2 IMPLANT
SPECIMEN JAR MEDIUM (MISCELLANEOUS) IMPLANT
SPONGE INTESTINAL PEANUT (DISPOSABLE) ×2 IMPLANT
STAPLER VISISTAT 35W (STAPLE) ×2 IMPLANT
SUT MNCRL AB 4-0 PS2 18 (SUTURE) ×2 IMPLANT
SUT VIC AB 2-0 SH 18 (SUTURE) ×2 IMPLANT
SUT VIC AB 3-0 SH 18 (SUTURE) ×2 IMPLANT
SUT VICRYL AB 2 0 TIES (SUTURE) ×2 IMPLANT
SUT VICRYL AB 3 0 TIES (SUTURE) ×2 IMPLANT
SYR BULB 3OZ (MISCELLANEOUS) ×2 IMPLANT
TOWEL OR 17X24 6PK STRL BLUE (TOWEL DISPOSABLE) ×2 IMPLANT
TOWEL OR 17X26 10 PK STRL BLUE (TOWEL DISPOSABLE) ×2 IMPLANT
TUBE CONNECTING 12X1/4 (SUCTIONS) ×2 IMPLANT
WATER STERILE IRR 1000ML POUR (IV SOLUTION) IMPLANT

## 2014-10-11 NOTE — Anesthesia Procedure Notes (Signed)
Procedure Name: Intubation Date/Time: 10/11/2014 7:32 AM Performed by: Garrison Columbus T Pre-anesthesia Checklist: Patient identified, Emergency Drugs available, Suction available and Patient being monitored Patient Re-evaluated:Patient Re-evaluated prior to inductionOxygen Delivery Method: Circle system utilized Preoxygenation: Pre-oxygenation with 100% oxygen Intubation Type: IV induction Ventilation: Mask ventilation without difficulty Laryngoscope Size: Miller and 2 Grade View: Grade I Tube type: Oral Tube size: 7.5 mm Number of attempts: 1 Airway Equipment and Method: Stylet and LTA kit utilized Placement Confirmation: ETT inserted through vocal cords under direct vision,  positive ETCO2 and breath sounds checked- equal and bilateral Secured at: 22 cm Tube secured with: Tape Dental Injury: Teeth and Oropharynx as per pre-operative assessment

## 2014-10-11 NOTE — Transfer of Care (Signed)
Immediate Anesthesia Transfer of Care Note  Patient: Charlene Jacobs  Procedure(s) Performed: Procedure(s): TOTAL THYROIDECTOMY (N/A)  Patient Location: PACU  Anesthesia Type:General  Level of Consciousness: awake, alert  and oriented  Airway & Oxygen Therapy: Patient Spontanous Breathing and Patient connected to nasal cannula oxygen  Post-op Assessment: Report given to RN, Post -op Vital signs reviewed and stable and Patient moving all extremities X 4  Post vital signs: Reviewed and stable  Last Vitals:  Filed Vitals:   10/11/14 0559  BP: 146/78  Pulse: 71  Temp: 36.4 C  Resp: 20    Complications: No apparent anesthesia complications

## 2014-10-11 NOTE — Brief Op Note (Signed)
10/11/2014  9:35 AM  PATIENT:  Charlene Jacobs  64 y.o. female  PRE-OPERATIVE DIAGNOSIS:  Thyroid Nodule  POST-OPERATIVE DIAGNOSIS:  Thyroid Nodule  PROCEDURE:  Procedure(s): TOTAL THYROIDECTOMY (N/A)  SURGEON:  Surgeon(s) and Role:    * Erroll Luna, MD - Primary    * Donnie Mesa, MD - Assisting       ANESTHESIA:   general  EBL:  Total I/O In: 1300 [I.V.:1300] Out: 20 [Blood:20]  BLOOD ADMINISTERED:none  DRAINS: none     SPECIMEN:  Source of Specimen:  thyroid   DISPOSITION OF SPECIMEN:  PATHOLOGY  COUNTS:  YES  TOURNIQUET:  * No tourniquets in log *  DICTATION: .Other Dictation: Dictation Number  [id number]  PLAN OF CARE: Admit for overnight observation  PATIENT DISPOSITION:  PACU - hemodynamically stable.   Delay start of Pharmacological VTE agent (>24hrs) due to surgical blood loss or risk of bleeding: yes

## 2014-10-11 NOTE — Progress Notes (Signed)
Report given to ronda hunt rn as caregiver 

## 2014-10-11 NOTE — H&P (Signed)
H&P   [name] (MR# [id number])      H&P Info    Author Note Status Last Update User Last Update Date/Time   Erroll Luna, MD Signed Erroll Luna, MD 09/05/2014 12:15 PM    H&P    Expand All Collapse All   Charlene Jacobs 09/05/2014 9:26 AM Location: [facility] Patient #: [id number] DOB: 1950-09-12 Married / Language: English / Race: White Female History of Present Illness (Thomas A. Cornett MD; 09/05/2014 12:09 PM) Patient words: thyroid   Pt sent at the request of Dr Thressa Sheller for mass in patients left neck for 4 months. Pt noted some swelling and a lump 4 months ago left side of her neck. Had a URI and stridor an couple of months ago which prompted work up with U/S and biopsy of a 3 cm left thyroid lobe mass. FNA showed a suspicious follicular lesion. Pt has no hoarseness, dysphagia or pain in neck. Denies wt gain or loss, heat or cold intolerance or excessive irritability. thyroid function normal.     CLINICAL DATA: Dominant left thyroid hypoechoic mass EXAM: ULTRASOUND GUIDED NEEDLE ASPIRATE BIOPSY OF THE THYROID GLAND COMPARISON: 08/01/2014 PROCEDURE: Thyroid biopsy was thoroughly discussed with the patient and questions were answered. The benefits, risks, alternatives, and complications were also discussed. The patient understands and wishes to proceed with the procedure. Written consent was obtained. Ultrasound was performed to localize and mark an adequate site for the biopsy. The patient was then prepped and draped in a normal sterile fashion. Local anesthesia was provided with 1% lidocaine. Using direct ultrasound guidance, 4 passes were made using needles into the nodule within the left lobe of the thyroid. Ultrasound was used to confirm needle placements on all occasions. Specimens were sent to Pathology for analysis. Complications: None immediate FINDINGS: Imaging confirms needle placed in the dominant left thyroid mass  IMPRESSION: Ultrasound guided needle aspirate biopsy performed of the dominant left thyroid mass. Electronically Signed By: Jerilynn Mages. Shick M.D. On: 08/24/2014 13:05       CLINICAL DATA: Thyroid nodules detected by CT of the neck.  EXAM: THYROID ULTRASOUND  TECHNIQUE: Ultrasound examination of the thyroid gland and adjacent soft tissues was performed.  COMPARISON: CT of the neck on 07/17/2014  FINDINGS: Right thyroid lobe  Measurements: 5.0 x 1.1 x 2.5 cm. Multiple small subcentimeter nodules are identified in the right lobe. The largest solid nodule measures approximately 1.1 x 0.6 x 1.0 cm. The largest mostly cystic nodule measures 0.7 cm.  Left thyroid lobe  Measurements: 4.5 x 1.5 x 2.3 cm. Ovoid solid nodule in the mid left lobe measures approximately 3.0 x 1.0 x 2.5 cm. This nodule is noncalcified. Tiny subcentimeter scattered nodules are seen elsewhere in the left lobe.  Isthmus  Thickness: 0.3 cm. No nodules visualized.  Lymphadenopathy  None visualized.  IMPRESSION: Multinodular thyroid goiter with dominant left thyroid nodule. This measures approximately 3.0 x 1.0 x 2.5 cm and meets size criteria for biopsy.  Findings meet consensus criteria for biopsy. Ultrasound-guided fine needle aspiration should be considered, as per the consensus statement: Management of Thyroid Nodules Detected at [place]: Society of Radiologists in [place]. Radiology 2005; [id number].   Electronically Signed By: Aletta Edouard M.D. On: 08/01/2014 10:14                    Diagnosis THYROID, LEFT MIDDLE POLE, FINE NEEDLE ASPIRATION (SPECIMEN 1 OF 1 COLLECTED 08/24/2014) FINDINGS SUSPICIOUS FOR A FOLLICULAR LESION /  NEOPLASM (BETHESDA CATEGORY IV). SEE COMMENT. Willeen Niece MD Pathologist, Electronic Signature (Case signed 08/25/2014).  The patient is a 64 year old female   Other Problems Marjean Donna, [place];  09/05/2014 9:27 AM) Back Pain Bladder Problems Hemorrhoids  Past Surgical History Marjean Donna, CMA; 09/05/2014 9:27 AM) No pertinent past surgical history  Diagnostic Studies History Marjean Donna, CMA; 09/05/2014 9:27 AM) Colonoscopy never Mammogram within last year Pap Smear 1-5 years ago  Allergies Marjean Donna, CMA; 09/05/2014 9:28 AM) No Known Drug Allergies 09/05/2014  Medication History (Sonya Bynum, CMA; 09/05/2014 9:28 AM) No Current Medications Medications Reconciled  Social History Marjean Donna, CMA; 09/05/2014 9:27 AM) Alcohol use Occasional alcohol use. Caffeine use Coffee, Tea. No drug use Tobacco use Never smoker.  Family History Marjean Donna, [place]; 09/05/2014 9:27 AM) Alcohol Abuse Father, Mother. Arthritis Mother. Breast Cancer Daughter. Cancer Brother, Father. Cerebrovascular Accident Mother. Heart Disease Mother. Thyroid problems Father.  Pregnancy / Birth History Marjean Donna, [place]; 09/05/2014 9:27 AM) Age at menarche 30 years. Age of menopause 51-55 Contraceptive History Oral contraceptives. Gravida 5 Maternal age 28-20 Para 5     Review of Systems ([place]; 09/05/2014 9:27 AM) General Not Present- Appetite Loss, Chills, Fatigue, Fever, Night Sweats, Weight Gain and Weight Loss. Skin Present- New Lesions. Not Present- Change in Wart/Mole, Dryness, Hives, Jaundice, Non-Healing Wounds, Rash and Ulcer. HEENT Present- Hearing Loss, Hoarseness and Ringing in the Ears. Not Present- Earache, Nose Bleed, Oral Ulcers, Seasonal Allergies, Sinus Pain, Sore Throat, Visual Disturbances, Wears glasses/contact lenses and Yellow Eyes. Respiratory Not Present- Bloody sputum, Chronic Cough, Difficulty Breathing, Snoring and Wheezing. Breast Not Present- Breast Mass, Breast Pain, Nipple Discharge and Skin Changes. Cardiovascular Not Present- Chest Pain, Difficulty Breathing Lying Down, Leg Cramps, Palpitations, Rapid Heart Rate, Shortness  of Breath and Swelling of Extremities. Gastrointestinal Present- Indigestion. Not Present- Abdominal Pain, Bloating, Bloody Stool, Change in Bowel Habits, Chronic diarrhea, Constipation, Difficulty Swallowing, Excessive gas, Gets full quickly at meals, Hemorrhoids, Nausea, Rectal Pain and Vomiting. Female Genitourinary Present- Nocturia. Not Present- Frequency, Painful Urination, Pelvic Pain and Urgency. Musculoskeletal Present- Joint Pain and Joint Stiffness. Not Present- Back Pain, Muscle Pain, Muscle Weakness and Swelling of Extremities. Neurological Present- Tremor. Not Present- Decreased Memory, Fainting, Headaches, Numbness, Seizures, Tingling, Trouble walking and Weakness. Psychiatric Not Present- Anxiety, Bipolar, Change in Sleep Pattern, Depression, Fearful and Frequent crying. Endocrine Not Present- Cold Intolerance, Excessive Hunger, Hair Changes, Heat Intolerance, Hot flashes and New Diabetes. Hematology Not Present- Easy Bruising, Excessive bleeding, Gland problems, HIV and Persistent Infections.  Vitals (Sonya Bynum CMA; 09/05/2014 9:28 AM) 09/05/2014 9:27 AM Weight: 165 lb Height: 64in Body Surface Area: 1.84 m Body Mass Index: 28.32 kg/m Temp.: 97F(Temporal)  Pulse: 76 (Regular)  BP: 122/80 (Sitting, Left Arm, Standard)     Physical Exam (Thomas A. Cornett MD; 09/05/2014 12:10 PM)  General Mental Status-Alert. General Appearance-Consistent with stated age. Hydration-Well hydrated. Voice-Normal.  Head and Neck Thyroid Gland Characteristics - enlarged. There is a palpable mass/nodule found and described as follows:: Size: Size - Approximate size: - Note: 3 cm mobile left thyroid lobe. There is a palpable mass/nodule found and described as follows: - The location of the mass/nodule is the - left mid-lobe. Other observations include - the mass/nodule is a dominant nodule, it is mobile and the mass/nodule moves on swallowing.  Chest and Lung  Exam Chest and lung exam reveals -quiet, even and easy respiratory effort with no use of accessory muscles and on auscultation, normal breath sounds,  no adventitious sounds and normal vocal resonance. Inspection Chest Wall - Normal. Back - normal.  Cardiovascular Cardiovascular examination reveals -normal heart sounds, regular rate and rhythm with no murmurs and normal pedal pulses bilaterally.  Neurologic Neurologic evaluation reveals -alert and oriented x 3 with no impairment of recent or remote memory. Mental Status-Normal.  Musculoskeletal Normal Exam - Left-Upper Extremity Strength Normal and Lower Extremity Strength Normal. Normal Exam - Right-Upper Extremity Strength Normal and Lower Extremity Strength Normal.  Lymphatic Head & Neck  General Head & Neck Lymphatics: Bilateral - Description - Normal.    Assessment & Plan (Thomas A. Cornett MD; 09/05/2014 12:14 PM)  THYROID NODULE (241.0  E04.1) Impression: discussed the need for surgery since lesion is suspicious and follicular histology. Iexplained it is difficult to differential benign from malignant adenoma with lobectomy. Discussed total thyroidectomy as well and the pro and cons to this approach and the avoidence of a second surgery if malignant. Discussed the risk of each and discussed the risks of bleeding, infection, nerve injury with loss of voice function, breathing poroblems requiring a hole in the neck to breathe, blood vessel injury, esophagus injury, calcium problems , parathyroid gland problems and the need for other surgery, treatment, death, blood clots.  She agrees to proceed with total thyroidectomy after discussion of the above.  Current Plans Pt Education - Thyroid Diseases: discussed with patient and provided information. Pt Education - Patient information: Thyroid nodules (The Basics): discussed with patient and provided information. Pt Education - Patient information: Thyroid nodules (Beyond  the Basics): discussed with patient and provided information. Pt Education - CCS Free Text Education/Instructions: discussed with patient and provided information.

## 2014-10-11 NOTE — Anesthesia Postprocedure Evaluation (Signed)
  Anesthesia Post-op Note  Patient: Charlene Jacobs  Procedure(s) Performed: Procedure(s) (LRB): TOTAL THYROIDECTOMY (N/A)  Patient Location: PACU  Anesthesia Type: General  Level of Consciousness: awake and alert   Airway and Oxygen Therapy: Patient Spontanous Breathing  Post-op Pain: mild  Post-op Assessment: Post-op Vital signs reviewed, Patient's Cardiovascular Status Stable, Respiratory Function Stable, Patent Airway and No signs of Nausea or vomiting  Last Vitals:  Filed Vitals:   10/11/14 1000  BP: 149/79  Pulse: 67  Temp:   Resp: 17    Post-op Vital Signs: stable   Complications: No apparent anesthesia complications

## 2014-10-11 NOTE — Progress Notes (Signed)
Off monitor/met pacu goals, stable, pending transfer to med surg bed

## 2014-10-11 NOTE — Interval H&P Note (Signed)
History and Physical Interval Note:  10/11/2014 7:14 AM  Charlene Jacobs  has presented today for surgery, with the diagnosis of Thyroid Nodule  The various methods of treatment have been discussed with the patient and family. After consideration of risks, benefits and other options for treatment, the patient has consented to  Procedure(s): TOTAL THYROIDECTOMY (N/A) as a surgical intervention .  The patient's history has been reviewed, patient examined, no change in status, stable for surgery.  I have reviewed the patient's chart and labs.  Questions were answered to the patient's satisfaction.     CORNETT,THOMAS A.

## 2014-10-11 NOTE — Op Note (Signed)
Charlene Jacobs, Charlene Jacobs NO.:  [id number]  MEDICAL RECORD NO.:  [id number]  LOCATION:  [facility]                         FACILITY:  [facility]  PHYSICIAN:  Marcello Moores A. Cornett, M.D.DATE OF BIRTH:  [birth year]  DATE OF PROCEDURE:  10/11/2014 DATE OF DISCHARGE:                              OPERATIVE REPORT   PREOPERATIVE DIAGNOSIS:  Thyroid nodule with multi-nodular goiter with suspicious fine needle aspiration findings.  POSTOPERATIVE DIAGNOSIS:  Thyroid nodule with multi-nodular goiter with suspicious fine needle aspiration findings.  PROCEDURE:  Total thyroidectomy.  SURGEON:  Marcello Moores A. Cornett, M.D.  ANESTHESIA:  General endotracheal anesthesia.  ESTIMATED BLOOD LOSS:  Minimal.  SPECIMENS:  Thyroid gland to pathology.  DRAINS:  None.  IV FLUIDS:  Approximately 2 L crystalloid.  DRAINS:  None.  INDICATIONS FOR PROCEDURE:  The patient is a 64 year old female, who underwent a fine needle aspiration by dominant thyroid nodule and this was found to be a follicular lesion and was felt to be suspicious.  She had no lymphadenopathy and otherwise workup was negative.  This was roughly 3 cm nodule by ultrasound.  We discussed treatment options to include lobectomy versus total thyroidectomy.  She had multiple other small nodules and had concerns about surveillance of these nodules which we discussed at length.  We discussed that lobectomy would be adequate treatment in the setting.  We certainly thought total thyroidectomy may be necessary if this came back to be a malignancy which we were not sure initially.  After lengthy discussion with her and her husband about lobectomy versus total thyroidectomy.  Pros and cons of each as well as the complications of each.  She opted to undergo total thyroidectomy. We discussed risks of surgery to include, but not be exclusive of bleeding, infection, airway compromise, injury to the airway, nerve injury, voice changes, permanent  voice changes, airway compromise requiring tracheostomy, injury to other nerves, injury to the carotid artery, injury to the jugular veins, possible injury to esophagus, the need for other operations, low calcium, the need for other treatments and/or procedures, as well as death, DVT, exacerbation underlying medical problems, and cardiovascular event.  She voiced understanding and agreed to proceed.  DESCRIPTION OF PROCEDURE:  The patient was met in the holding area. Questions were answered.  She was taken back to the operating room and placed supine on the OR table.  After induction of general endotracheal anesthesia, both arms were tucked.  A roll was placed under shoulder blades to extend her neck without any undue pressure.  She was then prepped and draped in sterile fashion.  Time-out was done to verify proper patient and procedure.  Transverse cervical incision was made 2 fingerbreadths above the sternal notch.  Dissection was carried through the platysma muscle and superior and inferior flaps were created into platysma muscle.  The midline muscles were noted and these were divided in the midline and the sternohyoid and normal hyoid muscles were reflected laterally.  The left lobe was done first.  We mobilized the superior pedicle using a right angle and using large clips and Harmonic scalpel.  Took down the superior thyroid vessels.  Care was taken to stay directly on the gland.  The middle  thyroid vein was taken down with the Harmonic scalpel.  The inferior thyroid pedicle and taken directly as it entered the gland using a large clips and the Harmonic scalpel. The inferior and superior parathyroid glands were noted and we stayed well away from these.  We then were carefully dissect along the edge of the thyroid gland staying well away from the recurrent laryngeal nerve on the left.  Of note, there is no adenopathy to palpation.  We mobilized the gland carefully all the way down  to the trachea. Once the left lobe was fully mobilized, we checked for hemostasis and ensured that.  The right side was then done.  The upper lobe felt very hard to me on the right thyroid gland.  We mobilized the inferior pole vessels in a similar fashion using large clips and Harmonic scalpel.  The middle thyroid vein was mobilized and divided with Harmonic scalpel.  We identified the superior parathyroid gland  and dissecting this away from the operative field, taking care not to devascularized this.  This was viable.  The upper right thyroid lobe was densely adherent down to the trachea and thyroid cartilage.  We were very careful because this was where the left recurrent laryngeal nerve was located.  I carefully exposed the entire area using a right angle and visualized the recurrent right laryngeal nerve.  We carefully dissected this portion of the thyroid gland off this.  There was no invasion of the nerves, but it was adherent, but there was a tissue plane between the two and it took some time to carefully dissected away to preserve the right recurrent laryngeal nerve.  The nerve did not be appeared damaged or divided after dissecting this part of the thyroid gland away from it, but it was very densely adherent.  The capsule appeared to be intact though there was no signs of any sort of invasive process as I could tell.  Once this was mobilized up, the thyroid was taken.  Thyroid gland with the cautery. The entire thyroid was then passed off the field.  After placing or orienting stitch in the left upper pole.  We inspected the operative bed.  We found this to be hemostatic after irrigation.  We located the right superior parathyroid gland easily and this was viable.  I could not see the right inferior gland.  The left and right parathyroid glands appeared to be intact.  The left recurrent laryngeal nerve was visualized.  There was no evidence of any injury to it.  The  right recurrent laryngeal nerve was visualized and excellent injury that I could see.  Surgicel snow was placed in the operative field.  I re- approximated the midline strap muscles using a 2-0 Vicryl.  A 3-0 Vicryl was used to approximate the platysma muscle and 4-0 Monocryl was used to close the skin.  Dermabond was applied.  All final counts were found to be correct of sponge, needle, and instruments.  The patient was then extubated and awoke, taken to recovery in satisfactory condition.     Thomas A. Cornett, M.D.     TAC/MEDQ  D:  10/11/2014  T:  10/11/2014  Job:  [id number]

## 2014-10-11 NOTE — Anesthesia Preprocedure Evaluation (Addendum)
Anesthesia Evaluation  Patient identified by MRN, date of birth, ID band Patient awake    Reviewed: Allergy & Precautions, NPO status , Patient's Chart, lab work & pertinent test results  Airway Mallampati: II  TM Distance: >3 FB Neck ROM: Full    Dental no notable dental hx. (+) Teeth Intact, Dental Advisory Given   Pulmonary neg pulmonary ROS,    Pulmonary exam normal breath sounds clear to auscultation       Cardiovascular negative cardio ROS Normal cardiovascular exam Rhythm:Regular Rate:Normal     Neuro/Psych negative neurological ROS  negative psych ROS   GI/Hepatic negative GI ROS, Neg liver ROS,   Endo/Other  negative endocrine ROS  Renal/GU negative Renal ROS  negative genitourinary   Musculoskeletal negative musculoskeletal ROS (+)   Abdominal   Peds negative pediatric ROS (+)  Hematology negative hematology ROS (+)   Anesthesia Other Findings   Reproductive/Obstetrics negative OB ROS                            Anesthesia Physical Anesthesia Plan  ASA: II  Anesthesia Plan: General   Post-op Pain Management:    Induction: Intravenous  Airway Management Planned:   Additional Equipment:   Intra-op Plan:   Post-operative Plan: Extubation in OR  Informed Consent: I have reviewed the patients History and Physical, chart, labs and discussed the procedure including the risks, benefits and alternatives for the proposed anesthesia with the patient or authorized representative who has indicated his/her understanding and acceptance.   Dental advisory given  Plan Discussed with: CRNA and Surgeon  Anesthesia Plan Comments:         Anesthesia Quick Evaluation

## 2014-10-12 ENCOUNTER — Encounter (HOSPITAL_COMMUNITY): Payer: Self-pay | Admitting: Surgery

## 2014-10-12 DIAGNOSIS — D34 Benign neoplasm of thyroid gland: Secondary | ICD-10-CM | POA: Diagnosis not present

## 2014-10-12 LAB — COMPREHENSIVE METABOLIC PANEL
ALT: 25 U/L (ref 14–54)
AST: 22 U/L (ref 15–41)
Albumin: 3.2 g/dL — ABNORMAL LOW (ref 3.5–5.0)
Alkaline Phosphatase: 54 U/L (ref 38–126)
Anion gap: 7 (ref 5–15)
BILIRUBIN TOTAL: 0.4 mg/dL (ref 0.3–1.2)
BUN: 10 mg/dL (ref 6–20)
CHLORIDE: 106 mmol/L (ref 101–111)
CO2: 28 mmol/L (ref 22–32)
CREATININE: 0.89 mg/dL (ref 0.44–1.00)
Calcium: 8.4 mg/dL — ABNORMAL LOW (ref 8.9–10.3)
Glucose, Bld: 146 mg/dL — ABNORMAL HIGH (ref 65–99)
Potassium: 4 mmol/L (ref 3.5–5.1)
Sodium: 141 mmol/L (ref 135–145)
TOTAL PROTEIN: 5.7 g/dL — AB (ref 6.5–8.1)

## 2014-10-12 MED ORDER — OXYCODONE HCL 5 MG PO TABS: 5.0000 mg | ORAL_TABLET | ORAL | Status: DC | PRN

## 2014-10-12 MED ORDER — LEVOTHYROXINE SODIUM 100 MCG PO TABS
100.0000 ug | ORAL_TABLET | Freq: Every day | ORAL | Status: DC
  Administered 2014-10-12: 100 ug via ORAL
  Filled 2014-10-12: qty 1

## 2014-10-12 MED ORDER — LEVOTHYROXINE SODIUM 100 MCG PO TABS: 100.0000 ug | ORAL_TABLET | Freq: Every day | ORAL | Status: DC

## 2014-10-12 MED ORDER — CALCIUM CARBONATE ANTACID 500 MG PO CHEW: 200.0000 mg | CHEWABLE_TABLET | Freq: Three times a day (TID) | ORAL | Status: DC

## 2014-10-12 MED ORDER — CALCIUM CARBONATE ANTACID 500 MG PO CHEW
200.0000 mg | CHEWABLE_TABLET | Freq: Three times a day (TID) | ORAL | Status: DC
  Administered 2014-10-12: 200 mg via ORAL
  Filled 2014-10-12: qty 1

## 2014-10-12 NOTE — Discharge Summary (Signed)
Physician Discharge Summary  Patient ID: MY RINKE MRN: [id number] DOB/AGE: [birth year] 66 y.o.  Admit date: 10/11/2014 Discharge date: 10/12/2014  Admission Diagnoses:thyroid nodule suspicious  Discharge Diagnoses:  Active Problems:   Thyroid nodule   Discharged Condition: good  Hospital Course: Pt DID WELL. No major issues.  She tolerated her diet.  She had good pain control.  No hematoma or hoarseness. Calcium 8.4.   Consults: None  Significant Diagnostic Studies: labs:  CMP Latest Ref Rng 10/12/2014 10/11/2014 09/30/2014  Glucose 65 - 99 mg/dL 146(H) 181(H) 99  BUN 6 - 20 mg/dL 10 11 16   Creatinine 0.44 - 1.00 mg/dL 0.89 0.94 0.91  Sodium 135 - 145 mmol/L 141 137 137  Potassium 3.5 - 5.1 mmol/L 4.0 4.0 3.9  Chloride 101 - 111 mmol/L 106 100(L) 104  CO2 22 - 32 mmol/L 28 27 25   Calcium 8.9 - 10.3 mg/dL 8.4(L) 8.9 9.3  Total Protein 6.5 - 8.1 g/dL 5.7(L) 6.8 6.7  Total Bilirubin 0.3 - 1.2 mg/dL 0.4 0.7 0.6  Alkaline Phos 38 - 126 U/L 54 68 69  AST 15 - 41 U/L 22 31 26   ALT 14 - 54 U/L 25 34 29     Treatments: surgery: total thyroidectomy   Discharge Exam: Blood pressure 103/66, pulse 56, temperature 97.5 F (36.4 C), temperature source Oral, resp. rate 16, height 5\' 4"  (1.626 m), weight 75.297 kg (166 lb), SpO2 98 %. Incision/Wound:incision CDI no hematoma voice strong   Disposition: 01-Home or Self Care     Medication List    ASK your doctor about these medications        b complex vitamins tablet  Take 1 tablet by mouth 2 (two) times a week.     CALCIUM PO  Take 1 tablet by mouth daily.     ibuprofen 200 MG tablet  Commonly known as:  ADVIL,MOTRIN  Take 200 mg by mouth daily as needed (joint pain).     VITAMIN D PO  Take 1 capsule by mouth daily.         Signed: CORNETT,THOMAS A. 10/12/2014, 7:19 AM

## 2014-10-12 NOTE — Progress Notes (Signed)
1 Day Post-Op  Subjective: Doing well  Ca 8.4 this am  No complaints or symptoms   Objective: Vital signs in last 24 hours: Temp:  [96.8 F (36 C)-98.2 F (36.8 C)] 97.5 F (36.4 C) (09/21 0607) Pulse Rate:  [56-78] 56 (09/21 0607) Resp:  [11-21] 16 (09/21 0607) BP: (103-158)/(59-85) 103/66 mmHg (09/21 0607) SpO2:  [95 %-100 %] 98 % (09/21 0607) Last BM Date: 10/11/14  Intake/Output from previous day: 09/20 0701 - 09/21 0700 In: 3636.3 [P.O.:1100; I.V.:2536.3] Out: 20 [Blood:20] Intake/Output this shift:    Incision/Wound:incision CDI no hematoma  Voice strong    Lab Results:  No results for input(s): WBC, HGB, HCT, PLT in the last 72 hours. BMET  Recent Labs  10/11/14 1555 10/12/14 0411  NA 137 141  K 4.0 4.0  CL 100* 106  CO2 27 28  GLUCOSE 181* 146*  BUN 11 10  CREATININE 0.94 0.89  CALCIUM 8.9 8.4*   PT/INR No results for input(s): LABPROT, INR in the last 72 hours. ABG No results for input(s): PHART, HCO3 in the last 72 hours.  Invalid input(s): PCO2, PO2  Studies/Results: Dg Chest 2 View  10/11/2014   CLINICAL DATA:  Preop thyroidectomy  EXAM: CHEST  2 VIEW  COMPARISON:  07/17/2014  FINDINGS: Lungs are clear.  No pleural effusion or pneumothorax.  The heart is normal in size.  Mild degenerative changes of the visualized thoracolumbar spine.  IMPRESSION: No evidence of acute cardiopulmonary disease.   Electronically Signed   By: Julian Hy M.D.   On: 10/11/2014 06:32    Anti-infectives: Anti-infectives    Start     Dose/Rate Route Frequency Ordered Stop   10/11/14 0715  ceFAZolin (ANCEF) 3 g in dextrose 5 % 50 mL IVPB     3 g 160 mL/hr over 30 Minutes Intravenous To [facility] 10/10/14 1303 10/11/14 0735      Assessment/Plan: s/p Procedure(s): TOTAL THYROIDECTOMY (N/A) Add Tums  1 tablet TID Discharge She is already on calcium and VIt D  Instructions given      Syeda Prickett A. 10/12/2014

## 2014-10-12 NOTE — Discharge Instructions (Signed)
GENERAL SURGERY: POST OP INSTRUCTIONS ° °1. DIET: Follow a light bland diet the first 24 hours after arrival home, such as soup, liquids, crackers, etc.  Be sure to include lots of fluids daily.  Avoid fast food or heavy meals as your are more likely to get nauseated.   °2. Take your usually prescribed home medications unless otherwise directed. °3. PAIN CONTROL: °a. Pain is best controlled by a usual combination of three different methods TOGETHER: °i. Ice/Heat °ii. Over the counter pain medication °iii. Prescription pain medication °b. Most patients will experience some swelling and bruising around the incisions.  Ice packs or heating pads (30-60 minutes up to 6 times a day) will help. Use ice for the first few days to help decrease swelling and bruising, then switch to heat to help relax tight/sore spots and speed recovery.  Some people prefer to use ice alone, heat alone, alternating between ice & heat.  Experiment to what works for you.  Swelling and bruising can take several weeks to resolve.   °c. It is helpful to take an over-the-counter pain medication regularly for the first few weeks.  Choose one of the following that works best for you: °i. Naproxen (Aleve, etc)  Two 220mg tabs twice a day °ii. Ibuprofen (Advil, etc) Three 200mg tabs four times a day (every meal & bedtime) °iii. Acetaminophen (Tylenol, etc) 500-650mg four times a day (every meal & bedtime) °d. A  prescription for pain medication (such as oxycodone, hydrocodone, etc) should be given to you upon discharge.  Take your pain medication as prescribed.  °i. If you are having problems/concerns with the prescription medicine (does not control pain, nausea, vomiting, rash, itching, etc), please call us [phone] to see if we need to switch you to a different pain medicine that will work better for you and/or control your side effect better. °ii. If you need a refill on your pain medication, please contact your pharmacy.  They will contact our  office to request authorization. Prescriptions will not be filled after 5 pm or on week-ends. °4. Avoid getting constipated.  Between the surgery and the pain medications, it is common to experience some constipation.  Increasing fluid intake and taking a fiber supplement (such as Metamucil, Citrucel, FiberCon, MiraLax, etc) 1-2 times a day regularly will usually help prevent this problem from occurring.  A mild laxative (prune juice, Milk of Magnesia, MiraLax, etc) should be taken according to package directions if there are no bowel movements after 48 hours.   °5. Wash / shower every day.  You may shower over the dressings as they are waterproof.  Continue to shower over incision(s) after the dressing is off. °6. Remove your waterproof bandages 5 days after surgery.  You may leave the incision open to air.  You may have skin tapes (Steri Strips) covering the incision(s).  Leave them on until one week, then remove.  You may replace a dressing/Band-Aid to cover the incision for comfort if you wish.  ° ° ° ° °7. ACTIVITIES as tolerated:   °a. You may resume regular (light) daily activities beginning the next day--such as daily self-care, walking, climbing stairs--gradually increasing activities as tolerated.  If you can walk 30 minutes without difficulty, it is safe to try more intense activity such as jogging, treadmill, bicycling, low-impact aerobics, swimming, etc. °b. Save the most intensive and strenuous activity for last such as sit-ups, heavy lifting, contact sports, etc  Refrain from any heavy lifting or straining until you   are off narcotics for pain control.   c. DO NOT PUSH THROUGH PAIN.  Let pain be your guide: If it hurts to do something, don't do it.  Pain is your body warning you to avoid that activity for another week until the pain goes down. d. You may drive when you are no longer taking prescription pain medication, you can comfortably wear a seatbelt, and you can safely maneuver your car and  apply brakes. e. Dennis Bast may have sexual intercourse when it is comfortable.  8. FOLLOW UP in our office a. Please call [facility] at [phone] to set up an appointment to see your surgeon in the office for a follow-up appointment approximately 2-3 weeks after your surgery. b. Make sure that you call for this appointment the day you arrive home to insure a convenient appointment time. 9. IF YOU HAVE DISABILITY OR FAMILY LEAVE FORMS, BRING THEM TO THE OFFICE FOR PROCESSING.  DO NOT GIVE THEM TO YOUR DOCTOR.   WHEN TO CALL us [phone]: 1. Poor pain control 2. Reactions / problems with new medications (rash/itching, nausea, etc)  3. Fever over 101.5 F (38.5 C) 4. Worsening swelling or bruising 5. Continued bleeding from incision. 6. Increased pain, redness, or drainage from the incision 7. Difficulty breathing / swallowing   The clinic staff is available to answer your questions during regular business hours (8:30am-5pm).  Please dont hesitate to call and ask to speak to one of our nurses for clinical concerns.   If you have a medical emergency, go to the nearest emergency room or call 911.  A surgeon from [facility] is always on call at the [facility], [place], [place], [place], [place]  [address] ? MAIN: [phone] ? TOLL FREE: [phone] ?  FAX [phone] [email or web address] Thyroidectomy Care After Refer to this sheet in the next few weeks. These instructions provide you with general information on caring for yourself after you leave the hospital. Your caregiver also may give you specific instructions. Your treatment has been planned according to the most current medical practices available, but problems sometimes occur. Call your caregiver if you have any problems or questions after your procedure. HOME CARE INSTRUCTIONS   It is normal to be sore for a few weeks following surgery. See your caregiver if  your pain seems to be getting worse rather than better.  Only take over-the-counter or prescription medicines for pain, discomfort, or fever as directed by your caregiver. Avoid taking medicines that contain aspirin and ibuprofen because they increase the risk of bleeding.  Shower rather than bathe until instructed otherwise by your caregiver.  Change your bandages (dressings) as directed by your caregiver.  You may resume a normal diet and activities as directed by your caregiver.  Avoid lifting weight greater than 20 lb (9 kg) or participating in heavy exercise or contact sports for 10 days or as instructed by your caregiver.  Make an appointment to see your caregiver for stitch (suture) or staple removal. SEEK MEDICAL CARE IF:   You have increased bleeding from your wound.  You have redness, swelling, or increasing pain from your wound or in your neck.  There is pus coming from your wound.  You have an oral temperature above 102 F (38.9 C).  There is a bad smell coming from the wound or dressing.  You develop lightheadedness or feel faint.  You develop numbness, tingling, or muscle spasms in your arms,  hands, feet, or face.  You have difficulty swallowing. SEEK IMMEDIATE MEDICAL CARE IF:   You develop a rash.  You have difficulty breathing.  You hear whistling noises that come from your chest.  You develop a cough that becomes increasingly worse.  You develop any reaction or side effects to medicines given.  There is swelling in your neck.  You develop changes in speech or hoarseness, which is getting worse. MAKE SURE YOU:   Understand these instructions.  Will watch your condition.  Will get help right away if you are not doing well or get worse. Document Released: 07/27/2004 Document Revised: 04/01/2011 Document Reviewed: 03/16/2010 [facility] Patient Information 2015 [place], [place]. This information is not intended to replace advice given to you by your  health care provider. Make sure you discuss any questions you have with your health care provider.

## 2014-10-12 NOTE — Progress Notes (Signed)
Discharge home. Home discharge instruction given, wound care discussed, follow up appointment set up by the MD. No  Questions verbalized.

## 2014-11-14 DIAGNOSIS — E663 Overweight: Secondary | ICD-10-CM | POA: Diagnosis not present

## 2014-11-14 DIAGNOSIS — E559 Vitamin D deficiency, unspecified: Secondary | ICD-10-CM | POA: Diagnosis not present

## 2014-11-14 DIAGNOSIS — Z0000 Encounter for general adult medical examination without abnormal findings: Secondary | ICD-10-CM | POA: Diagnosis not present

## 2014-11-14 DIAGNOSIS — E039 Hypothyroidism, unspecified: Secondary | ICD-10-CM | POA: Diagnosis not present

## 2014-11-14 DIAGNOSIS — Z23 Encounter for immunization: Secondary | ICD-10-CM | POA: Diagnosis not present

## 2014-11-14 DIAGNOSIS — E785 Hyperlipidemia, unspecified: Secondary | ICD-10-CM | POA: Diagnosis not present

## 2015-02-07 DIAGNOSIS — E039 Hypothyroidism, unspecified: Secondary | ICD-10-CM | POA: Diagnosis not present

## 2015-02-07 DIAGNOSIS — E785 Hyperlipidemia, unspecified: Secondary | ICD-10-CM | POA: Diagnosis not present

## 2015-02-07 DIAGNOSIS — E559 Vitamin D deficiency, unspecified: Secondary | ICD-10-CM | POA: Diagnosis not present

## 2015-03-22 DIAGNOSIS — Z23 Encounter for immunization: Secondary | ICD-10-CM | POA: Diagnosis not present

## 2015-03-22 DIAGNOSIS — N182 Chronic kidney disease, stage 2 (mild): Secondary | ICD-10-CM | POA: Diagnosis not present

## 2015-03-22 DIAGNOSIS — E559 Vitamin D deficiency, unspecified: Secondary | ICD-10-CM | POA: Diagnosis not present

## 2015-03-22 DIAGNOSIS — E039 Hypothyroidism, unspecified: Secondary | ICD-10-CM | POA: Diagnosis not present

## 2015-03-22 DIAGNOSIS — E785 Hyperlipidemia, unspecified: Secondary | ICD-10-CM | POA: Diagnosis not present

## 2015-06-20 DIAGNOSIS — R399 Unspecified symptoms and signs involving the genitourinary system: Secondary | ICD-10-CM | POA: Diagnosis not present

## 2015-06-20 DIAGNOSIS — N390 Urinary tract infection, site not specified: Secondary | ICD-10-CM | POA: Diagnosis not present

## 2015-10-16 DIAGNOSIS — E611 Iron deficiency: Secondary | ICD-10-CM | POA: Diagnosis not present

## 2015-10-16 DIAGNOSIS — E7210 Disorders of sulfur-bearing amino-acid metabolism, unspecified: Secondary | ICD-10-CM | POA: Diagnosis not present

## 2015-10-16 DIAGNOSIS — R739 Hyperglycemia, unspecified: Secondary | ICD-10-CM | POA: Diagnosis not present

## 2015-10-16 DIAGNOSIS — E785 Hyperlipidemia, unspecified: Secondary | ICD-10-CM | POA: Diagnosis not present

## 2015-10-16 DIAGNOSIS — E039 Hypothyroidism, unspecified: Secondary | ICD-10-CM | POA: Diagnosis not present

## 2015-10-16 DIAGNOSIS — E509 Vitamin A deficiency, unspecified: Secondary | ICD-10-CM | POA: Diagnosis not present

## 2015-10-16 DIAGNOSIS — E559 Vitamin D deficiency, unspecified: Secondary | ICD-10-CM | POA: Diagnosis not present

## 2015-11-01 DIAGNOSIS — E7210 Disorders of sulfur-bearing amino-acid metabolism, unspecified: Secondary | ICD-10-CM | POA: Diagnosis not present

## 2015-11-01 DIAGNOSIS — Z91018 Allergy to other foods: Secondary | ICD-10-CM | POA: Diagnosis not present

## 2016-02-26 DIAGNOSIS — R69 Illness, unspecified: Secondary | ICD-10-CM | POA: Diagnosis not present

## 2016-06-26 DIAGNOSIS — H40053 Ocular hypertension, bilateral: Secondary | ICD-10-CM | POA: Diagnosis not present

## 2016-06-26 DIAGNOSIS — H2513 Age-related nuclear cataract, bilateral: Secondary | ICD-10-CM | POA: Diagnosis not present

## 2016-06-26 DIAGNOSIS — H43811 Vitreous degeneration, right eye: Secondary | ICD-10-CM | POA: Diagnosis not present

## 2016-06-27 DIAGNOSIS — Z01411 Encounter for gynecological examination (general) (routine) with abnormal findings: Secondary | ICD-10-CM | POA: Diagnosis not present

## 2016-06-27 DIAGNOSIS — Z1231 Encounter for screening mammogram for malignant neoplasm of breast: Secondary | ICD-10-CM | POA: Diagnosis not present

## 2016-06-27 DIAGNOSIS — Z6828 Body mass index (BMI) 28.0-28.9, adult: Secondary | ICD-10-CM | POA: Diagnosis not present

## 2016-06-27 DIAGNOSIS — Z124 Encounter for screening for malignant neoplasm of cervix: Secondary | ICD-10-CM | POA: Diagnosis not present

## 2016-06-27 DIAGNOSIS — Z1211 Encounter for screening for malignant neoplasm of colon: Secondary | ICD-10-CM | POA: Diagnosis not present

## 2016-07-30 DIAGNOSIS — Z1211 Encounter for screening for malignant neoplasm of colon: Secondary | ICD-10-CM | POA: Diagnosis not present

## 2016-09-09 DIAGNOSIS — D127 Benign neoplasm of rectosigmoid junction: Secondary | ICD-10-CM | POA: Diagnosis not present

## 2016-09-09 DIAGNOSIS — K6389 Other specified diseases of intestine: Secondary | ICD-10-CM | POA: Diagnosis not present

## 2016-09-09 DIAGNOSIS — D122 Benign neoplasm of ascending colon: Secondary | ICD-10-CM | POA: Diagnosis not present

## 2016-09-09 DIAGNOSIS — Z1211 Encounter for screening for malignant neoplasm of colon: Secondary | ICD-10-CM | POA: Diagnosis not present

## 2016-09-09 DIAGNOSIS — K635 Polyp of colon: Secondary | ICD-10-CM | POA: Diagnosis not present

## 2016-10-02 DIAGNOSIS — E039 Hypothyroidism, unspecified: Secondary | ICD-10-CM | POA: Diagnosis not present

## 2016-10-02 DIAGNOSIS — N951 Menopausal and female climacteric states: Secondary | ICD-10-CM | POA: Diagnosis not present

## 2016-10-02 DIAGNOSIS — E559 Vitamin D deficiency, unspecified: Secondary | ICD-10-CM | POA: Diagnosis not present

## 2016-10-02 DIAGNOSIS — E611 Iron deficiency: Secondary | ICD-10-CM | POA: Diagnosis not present

## 2016-10-02 DIAGNOSIS — E785 Hyperlipidemia, unspecified: Secondary | ICD-10-CM | POA: Diagnosis not present

## 2016-10-02 DIAGNOSIS — E7210 Disorders of sulfur-bearing amino-acid metabolism, unspecified: Secondary | ICD-10-CM | POA: Diagnosis not present

## 2016-10-02 DIAGNOSIS — E279 Disorder of adrenal gland, unspecified: Secondary | ICD-10-CM | POA: Diagnosis not present

## 2016-10-15 DIAGNOSIS — Z1231 Encounter for screening mammogram for malignant neoplasm of breast: Secondary | ICD-10-CM | POA: Diagnosis not present

## 2017-04-15 IMAGING — CT CT NECK W/ CM
3 of 5 series · 12 of 33 positions shown, 14 images · IV contrast (omnipaque)
Comparison: None.

CLINICAL DATA: Cough. Throat tightness. Left neck palpable nodule.

EXAM:
CT NECK WITH CONTRAST
TECHNIQUE: Multidetector CT imaging of the neck was performed using the
standard protocol following the bolus administration of intravenous
contrast.
CONTRAST:  75mL OMNIPAQUE IOHEXOL 300 MG/ML  SOLN

[Series 2010: coronal · coronal · 0.37mm/px · 3 of 75 slices shown]
[im 15/75  bone]
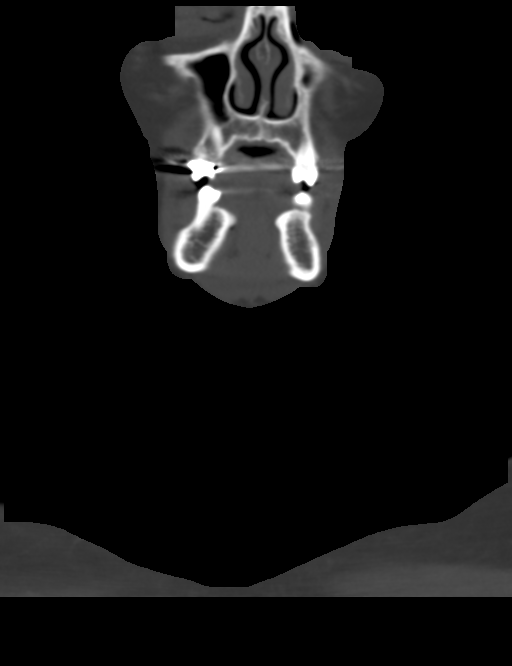
[im 30/75  bone]
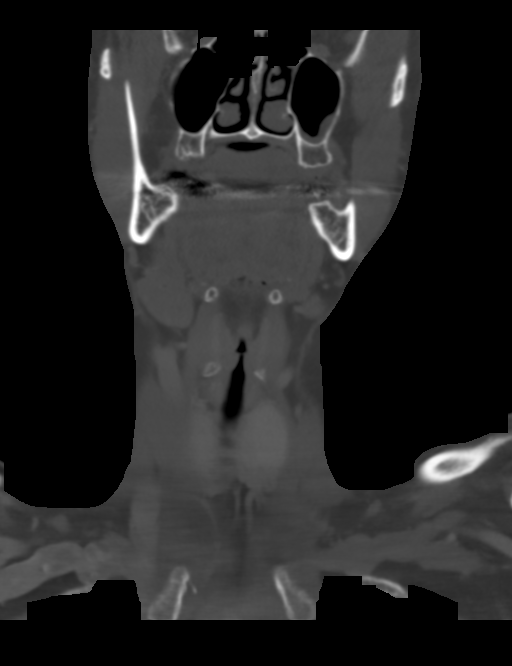
[im 45/75  bone]
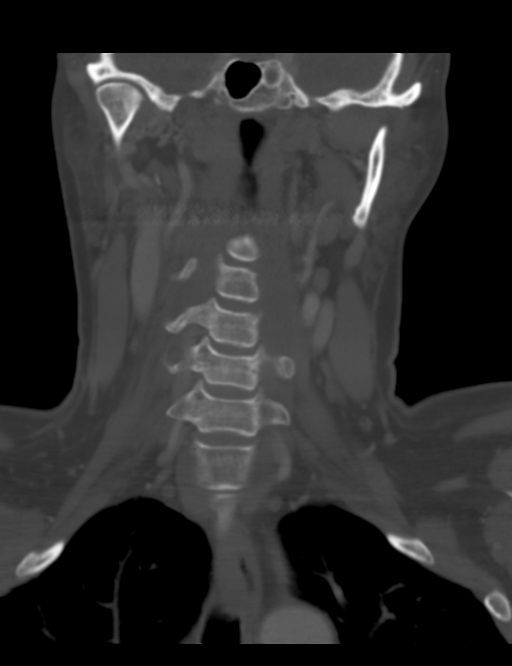

[Series 2011: sagittal · sagittal · 0.37mm/px · 5 of 71 slices shown, 6 images]
[im 24/71  bone]
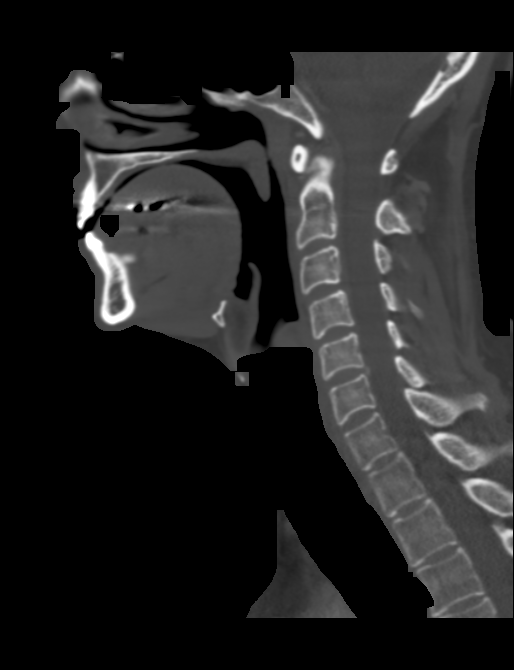
[im 30/71  bone]
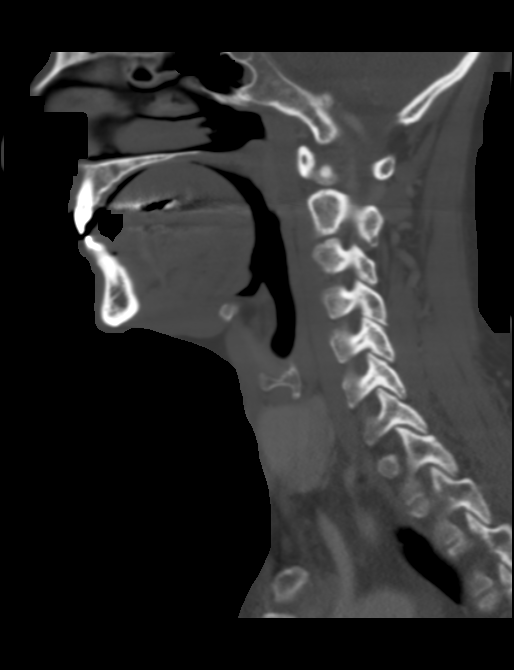
[im 36/71  soft-tissue]
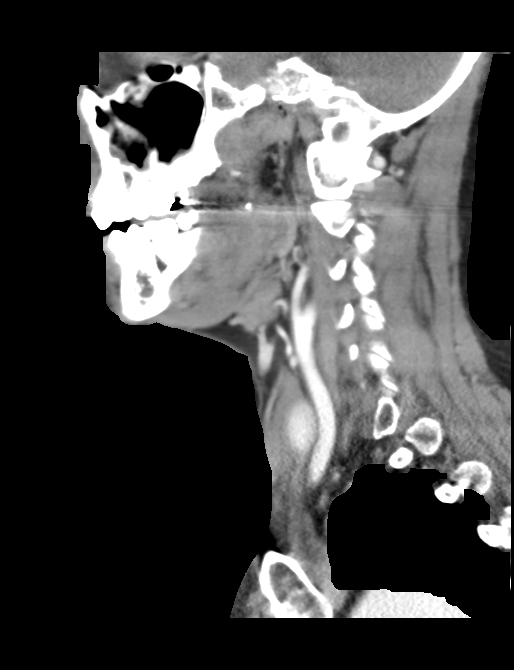
[im 36/71  bone]
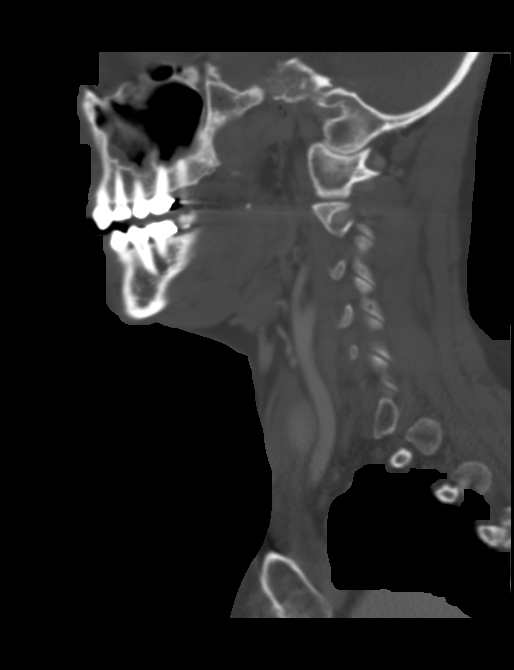
[im 41/71  bone]
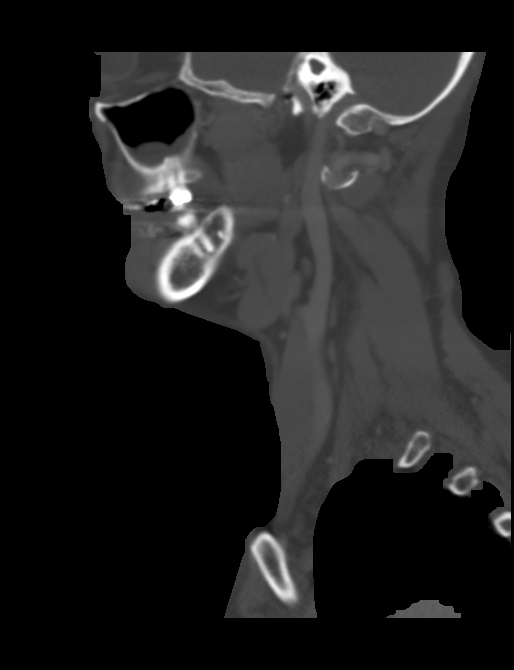
[im 47/71  bone]
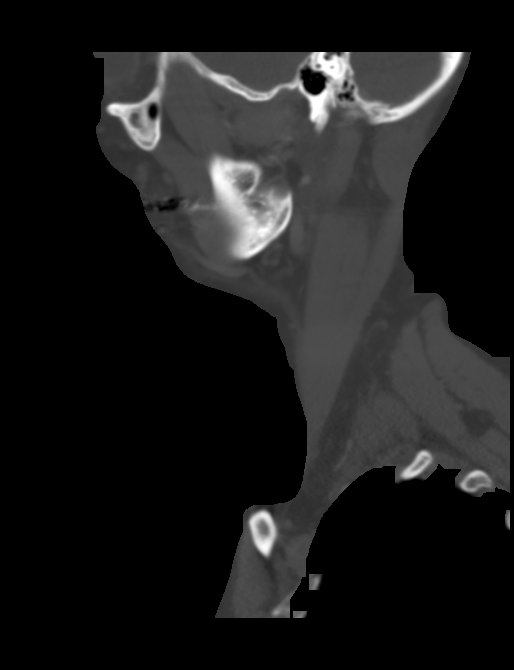

[Series 2012: orthogonal · axial · 0.37mm/px · z∈[+60,+183]mm · 4 of 106 slices shown, 5 images]
[im 22/106  soft-tissue]
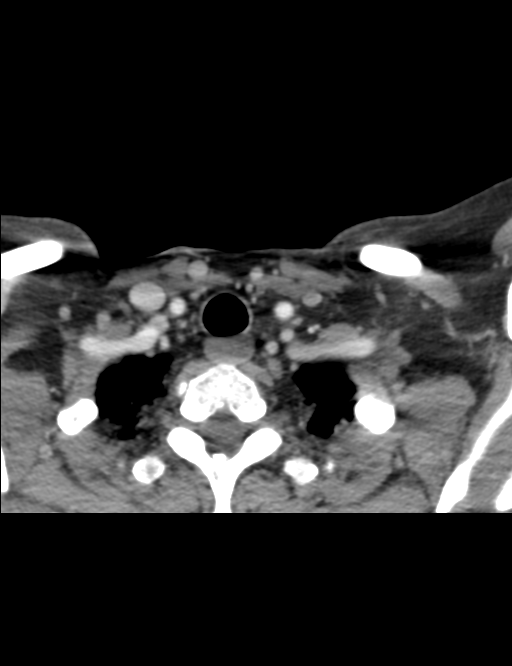
[im 22/106  bone]
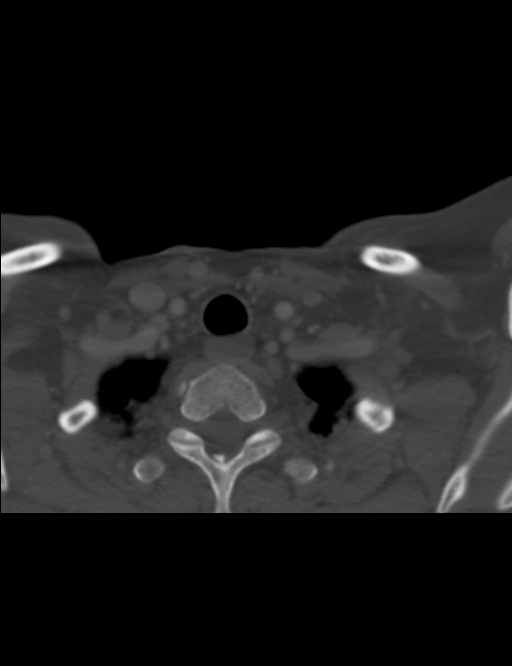
[im 43/106  bone]
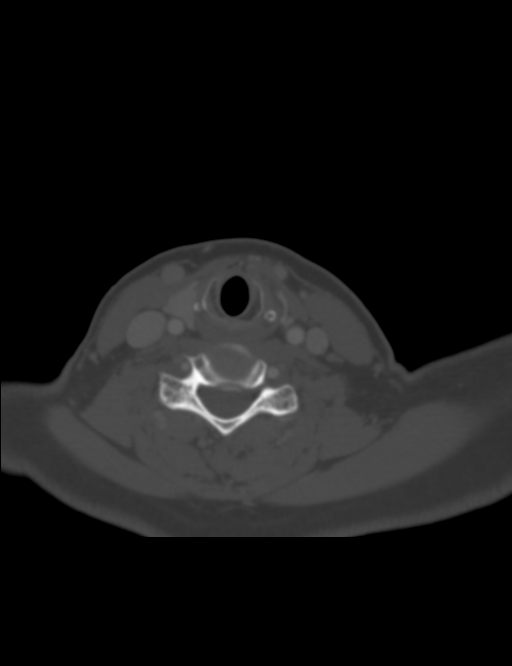
[im 64/106  bone]
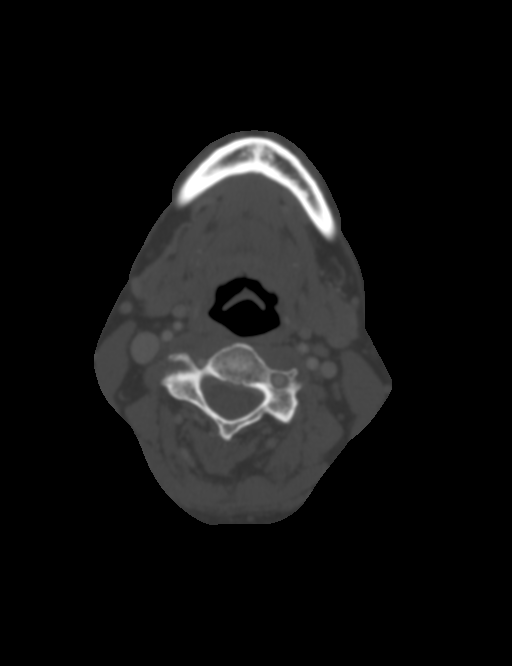
[im 85/106  bone]
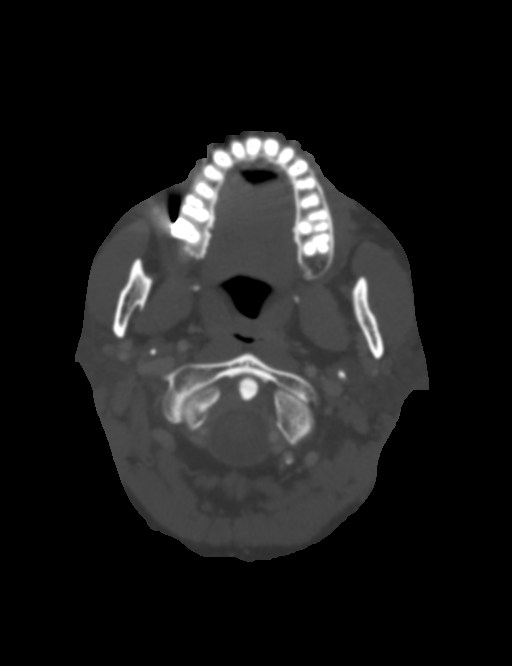

[12 of 33 positions shown; findings below may reference images not displayed]

FINDINGS: Epiglottis, aryepiglottic folds, and glottic structures normal.
Small hypodense nodules are present in the right thyroid lobe and
the left thyroid lobe is diffusely enlarged and questionably
hyperdense.

No pathologic adenopathy in the neck is identified. There is chronic
left maxillary sinusitis and some frothy fluid in the left sphenoid
sinus.

Parapharyngeal spaces appear normal. Mild biapical pleural
parenchymal scarring.

No significant tonsillar enlargement.

Mild facet arthropathy on the right at C3-4. Calcified disc bulge at
C5-6 without definite impingement.
IMPRESSION: 1. Small hypodense nodules in the right thyroid gland, with
generalized enlargement of the left thyroid lobe and possibly a
indistinct high density lesion anteriorly in the left thyroid lobe.
Consider further evaluation with thyroid ultrasound (can be
performed in the non-emergent setting). If patient is clinically
hyperthyroid, consider nuclear medicine thyroid uptake and scan.
2. Mild acute left sphenoid sinusitis. Chronic left maxillary
sinusitis.
3. Minimal cervical spondylosis and degenerative disc disease.

## 2017-04-15 IMAGING — DX DG CHEST 2V
2 series · 2 of 2 positions shown · non-contrast
Comparison: None.

CLINICAL DATA: Shortness of breath.  Wheezing.

EXAM:
CHEST  2 VIEW

[chest pa]
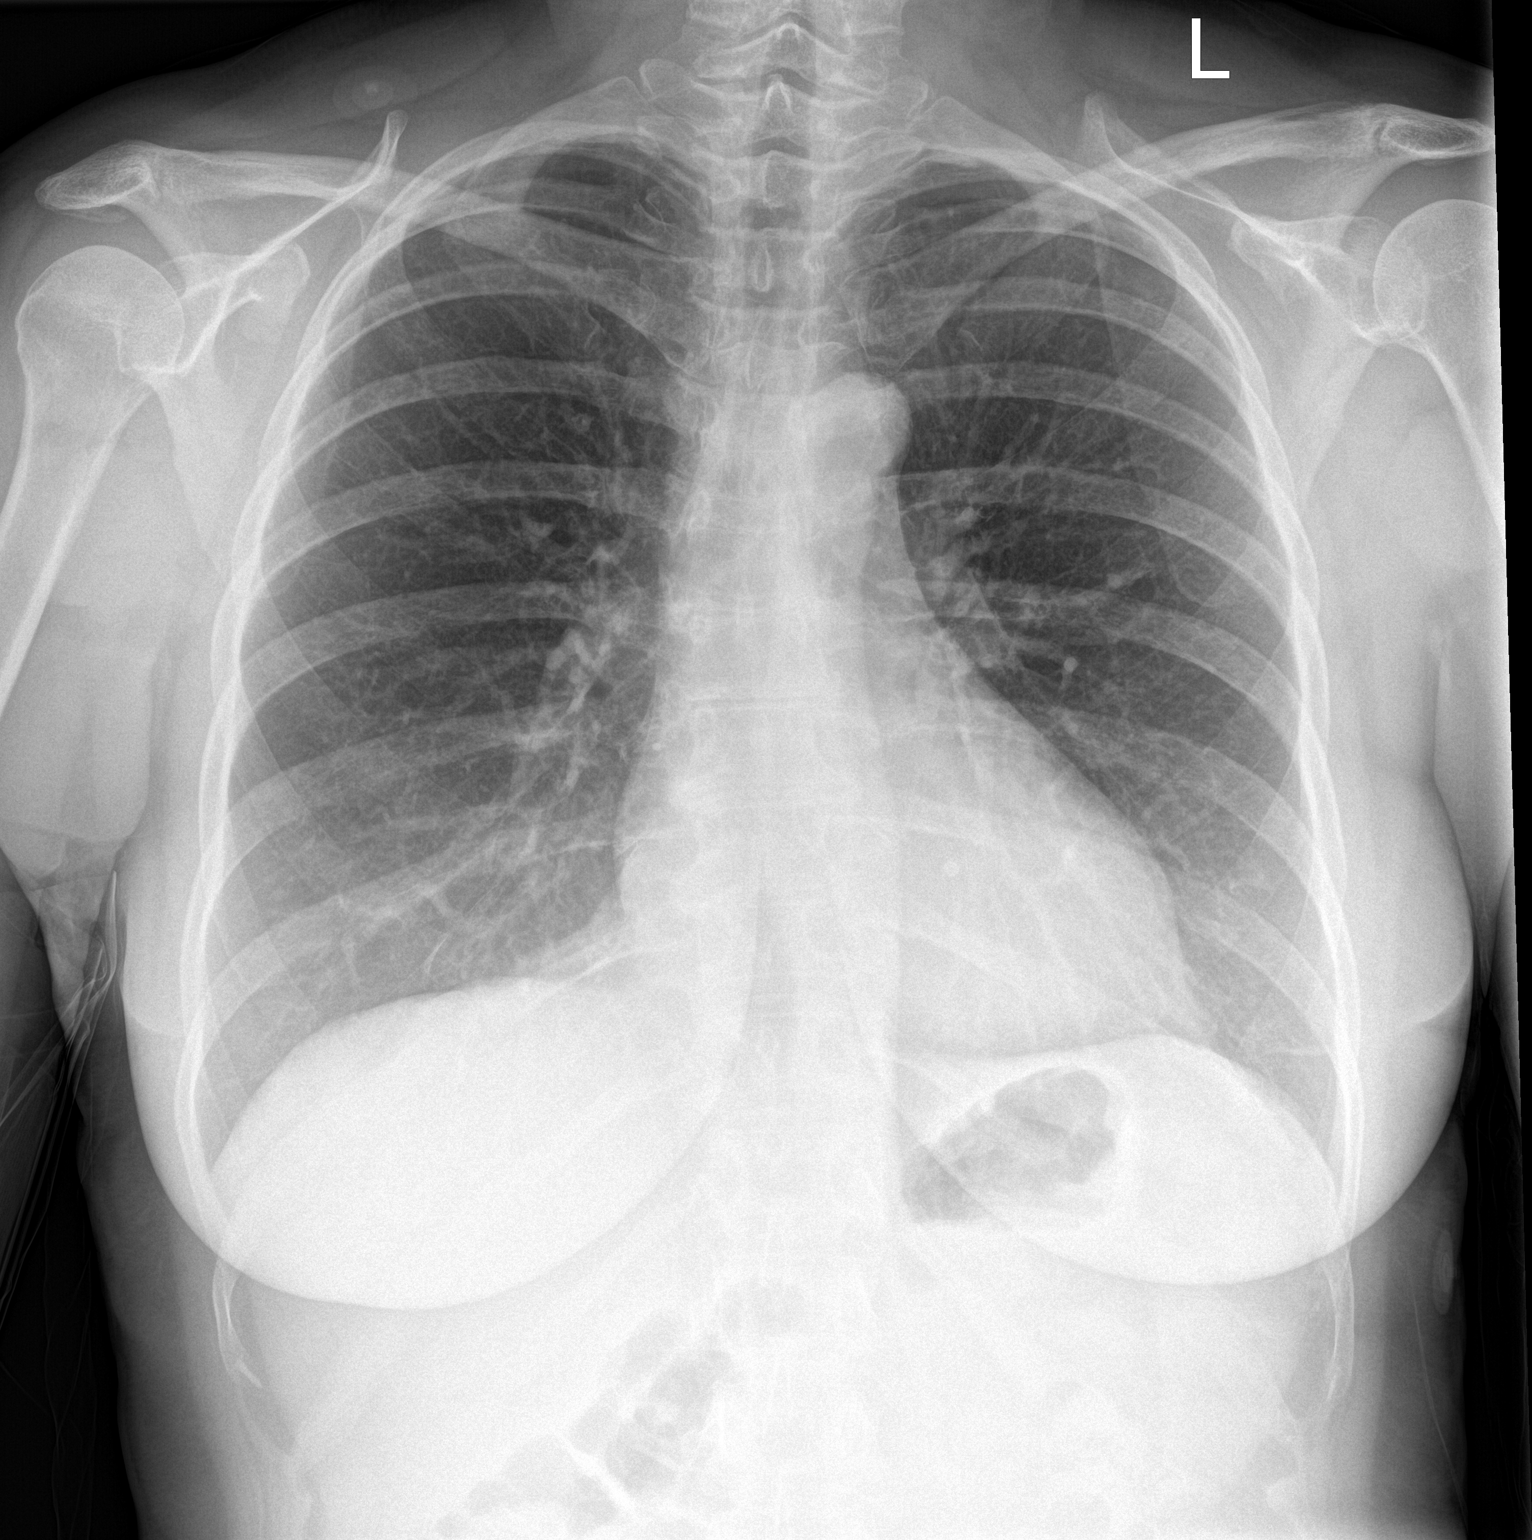

[chest lat]
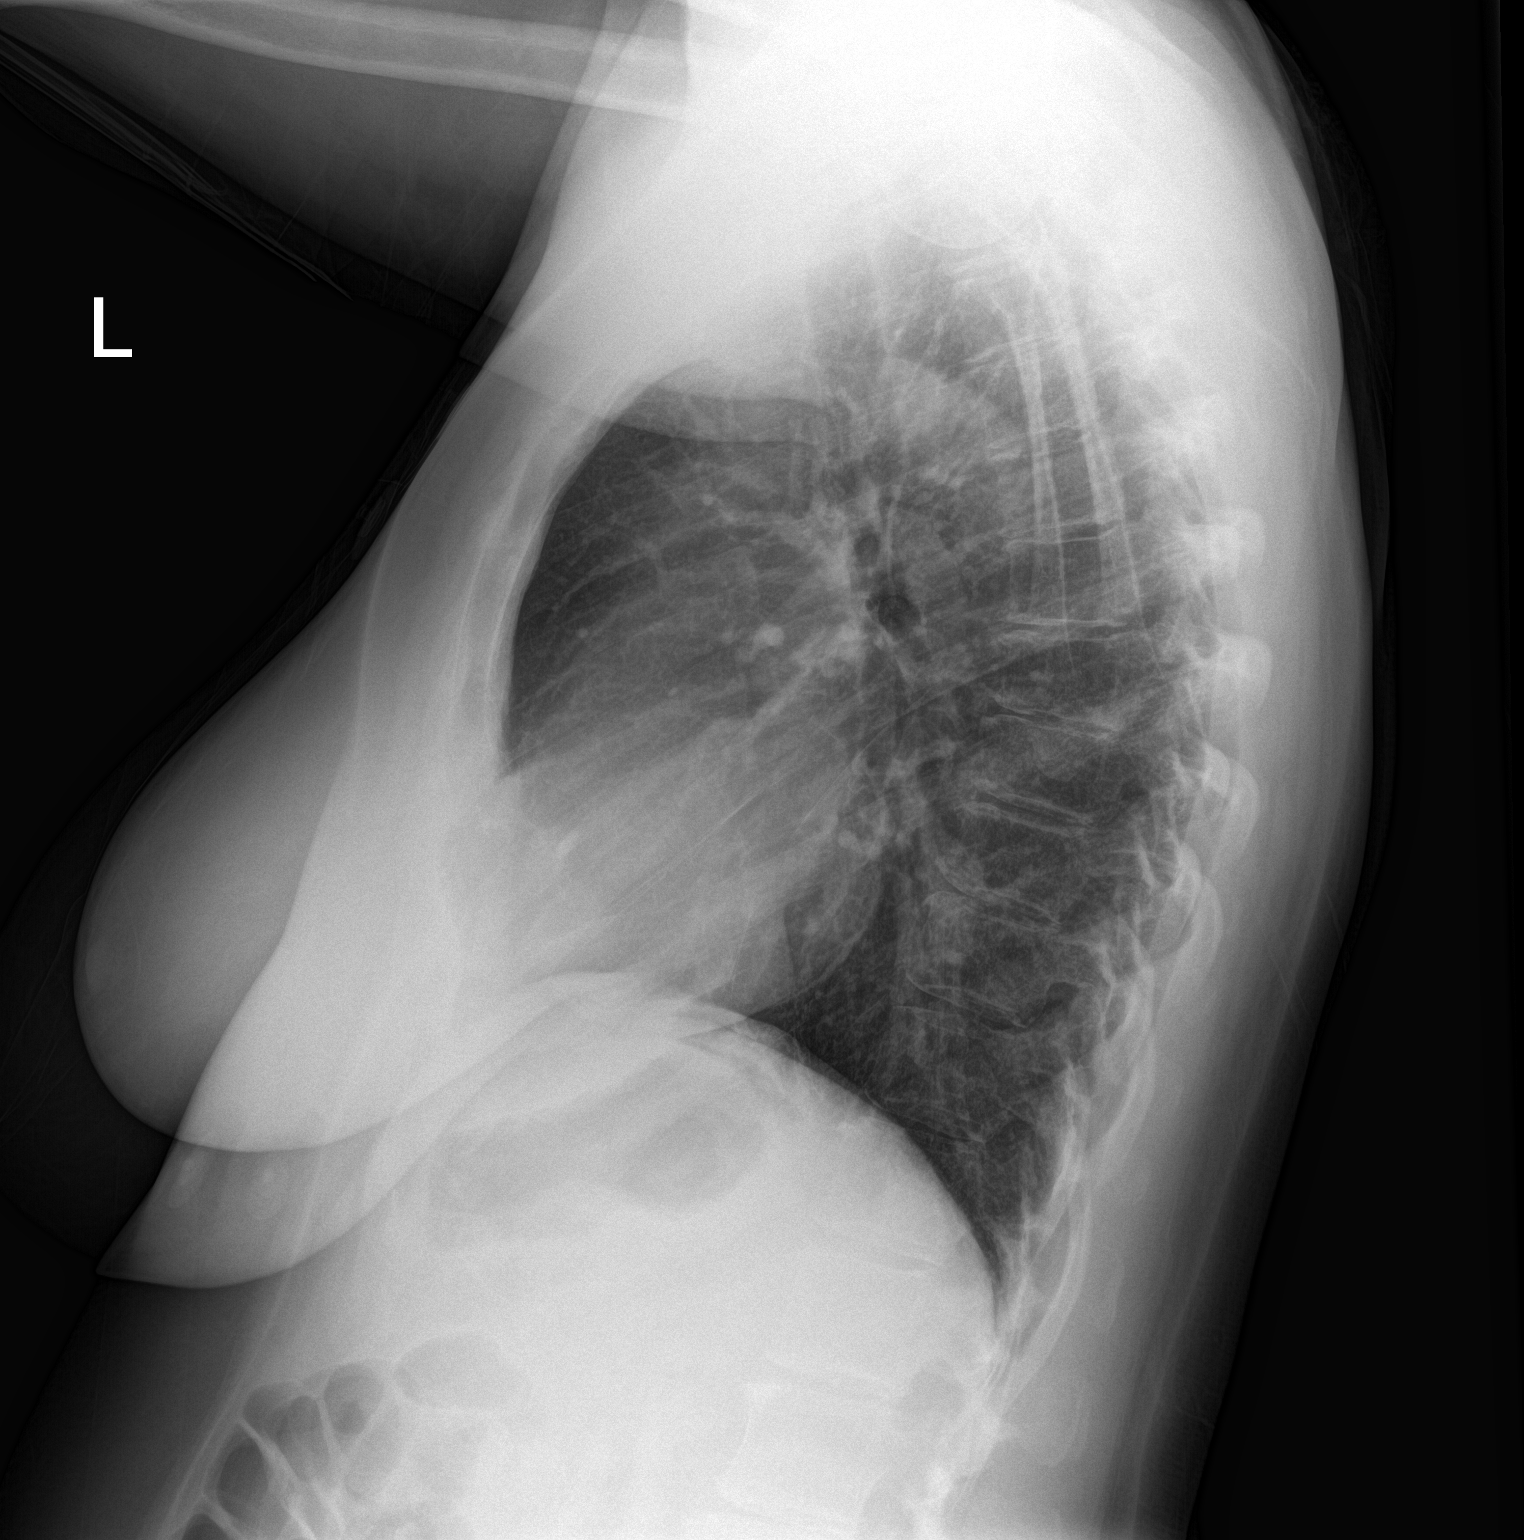

[2 of 2 positions shown; findings below may reference images not displayed]

FINDINGS: The heart size and mediastinal contours are within normal limits.
Both lungs are clear. The visualized skeletal structures are
unremarkable.
IMPRESSION: No active cardiopulmonary disease.

## 2017-04-30 IMAGING — US US SOFT TISSUE HEAD/NECK
1 series · 13 of 25 positions shown · non-contrast
Comparison: CT of the neck on 07/17/2014

CLINICAL DATA: Thyroid nodules detected by CT of the neck.

EXAM:
THYROID ULTRASOUND
TECHNIQUE: Ultrasound examination of the thyroid gland and adjacent soft
tissues was performed.

[Series 1: us soft tissue head/neck · 0.06mm/px · 13 of 57 slices shown]
[im 1/57]
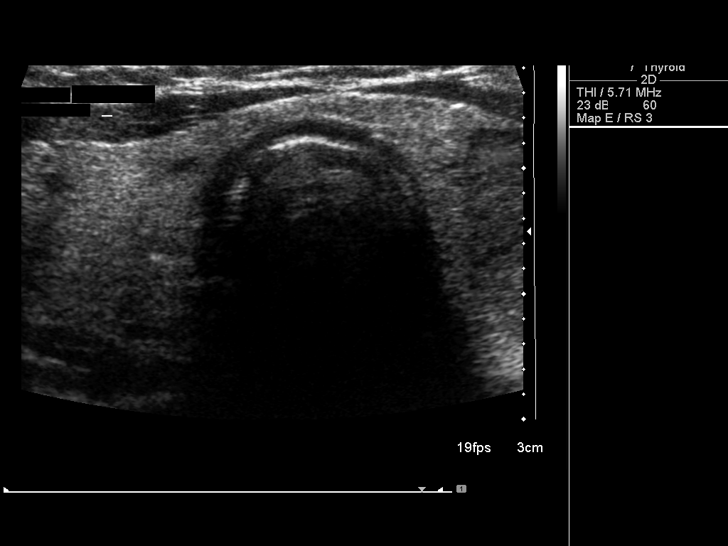
[im 5/57]
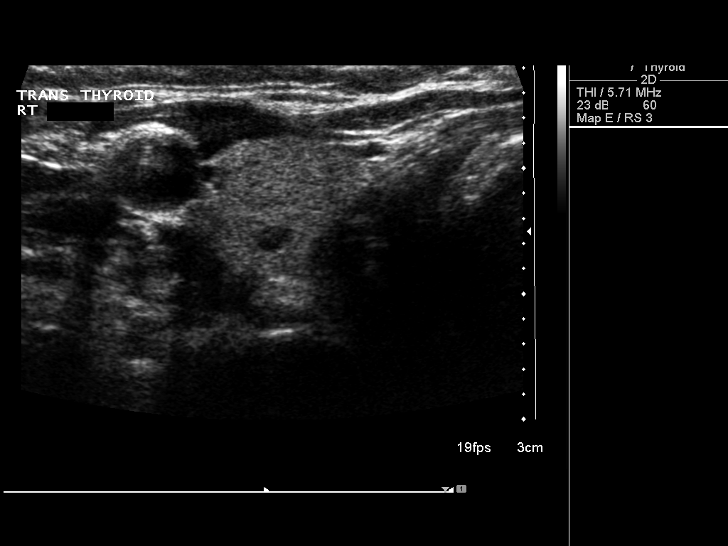
[im 10/57]
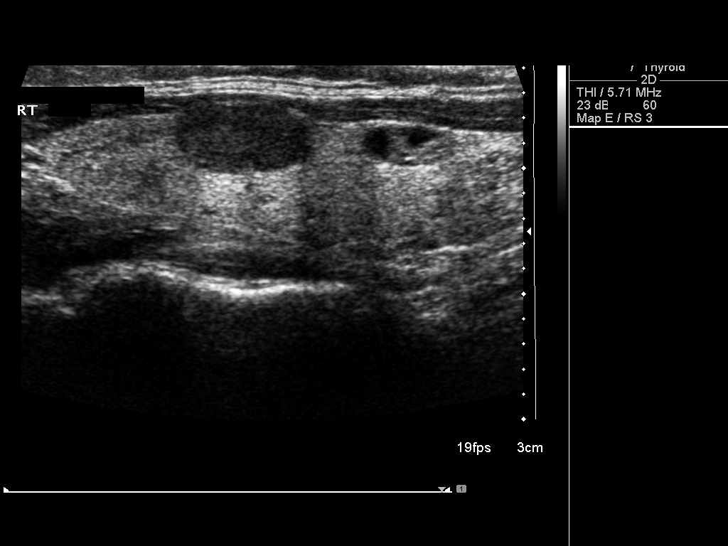
[im 15/57]
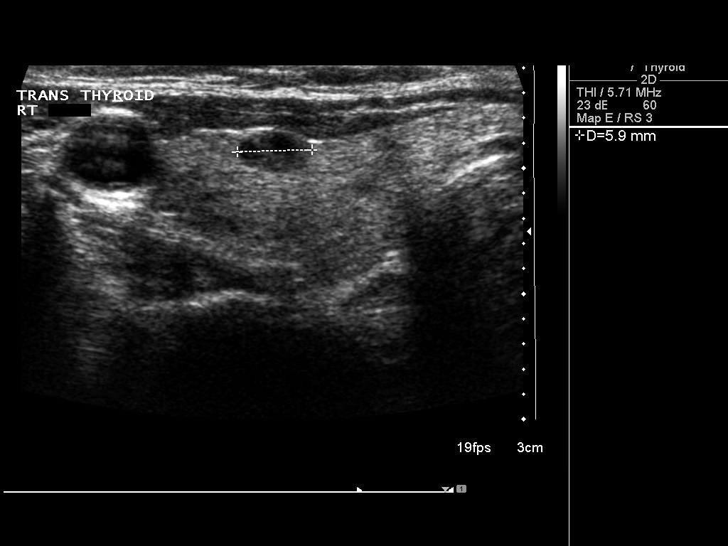
[im 19/57]
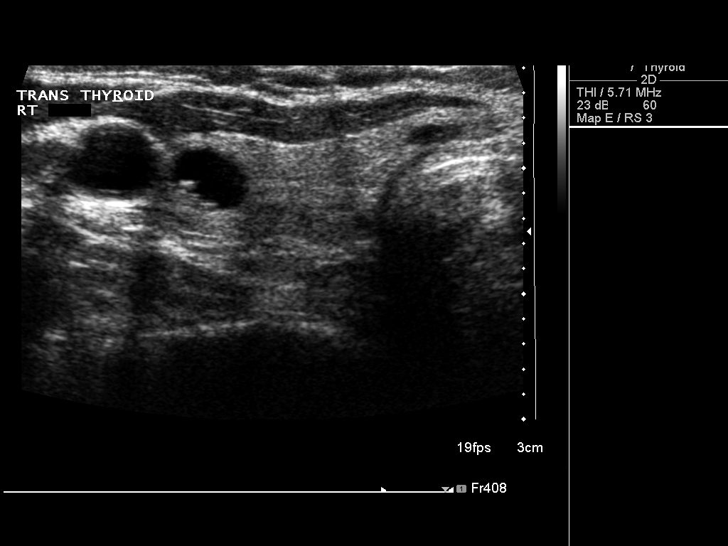
[im 24/57]
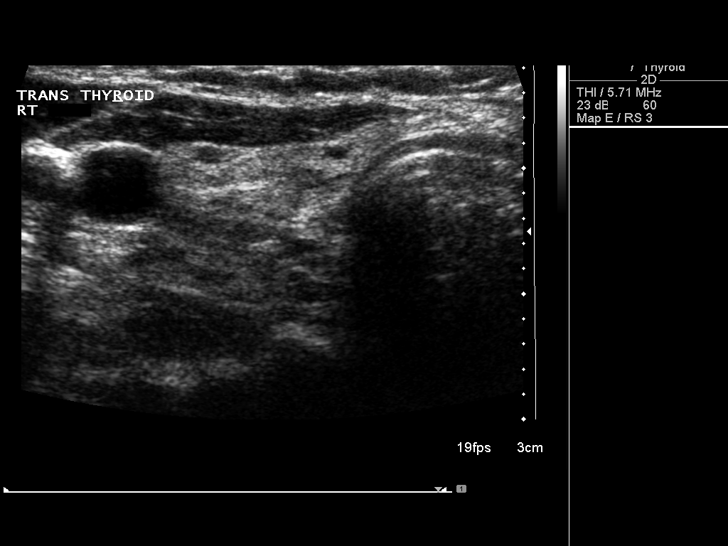
[im 29/57]
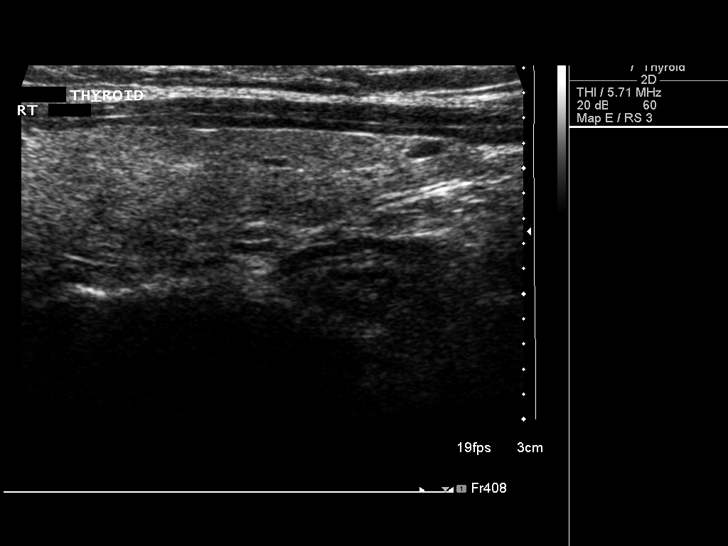
[im 33/57]
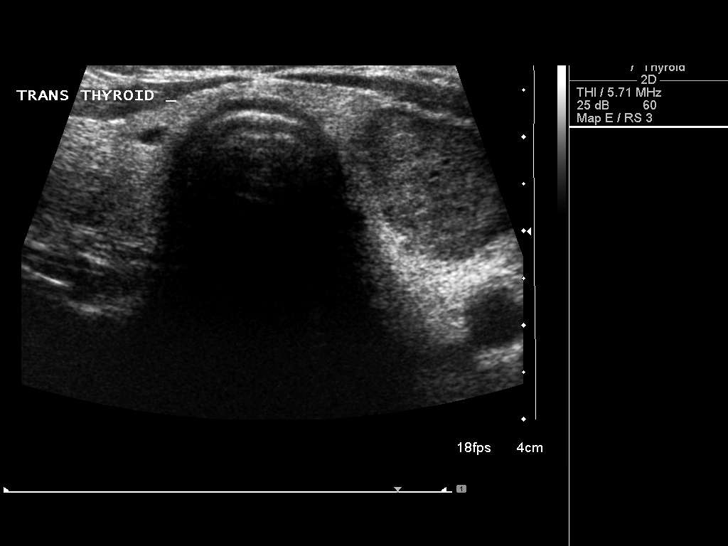
[im 38/57]
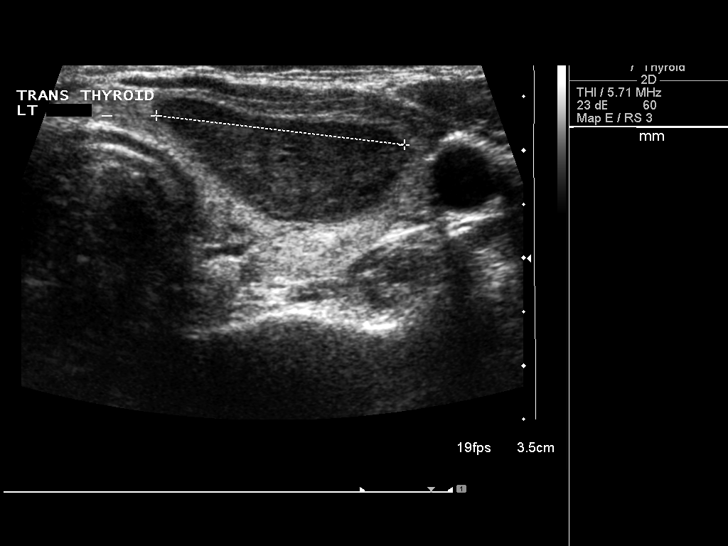
[im 43/57]
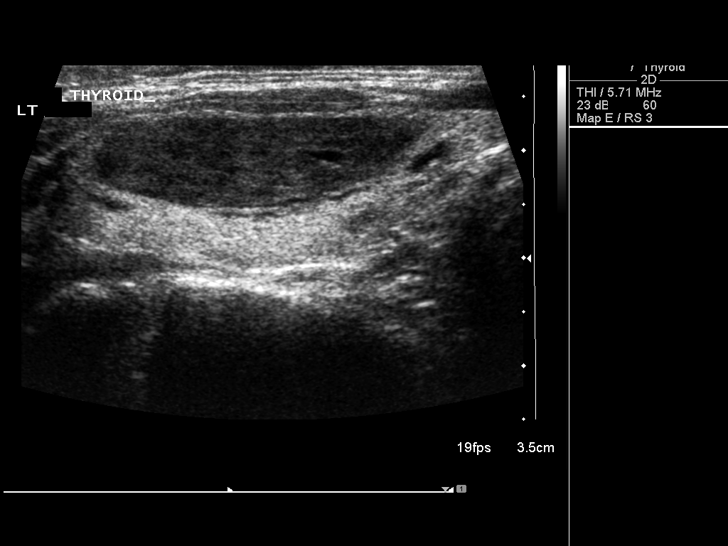
[im 47/57]
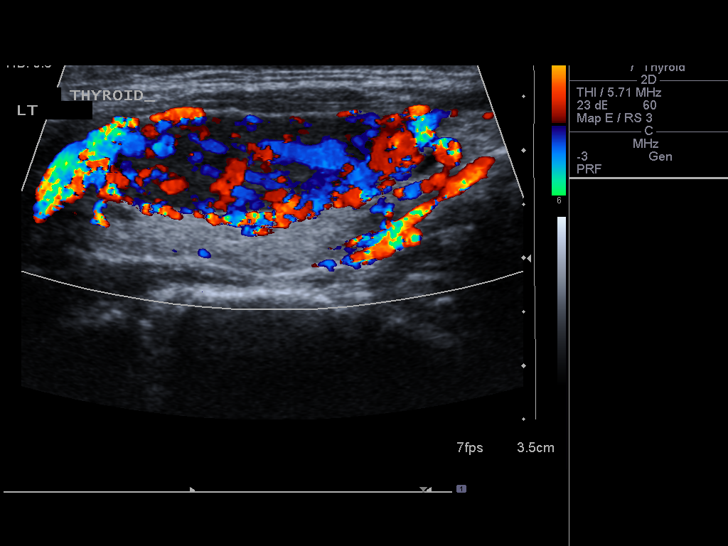
[im 52/57]
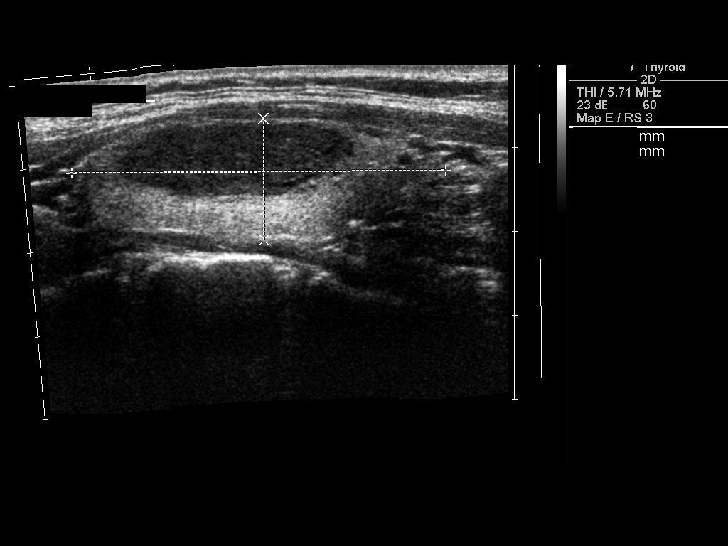
[im 57/57]
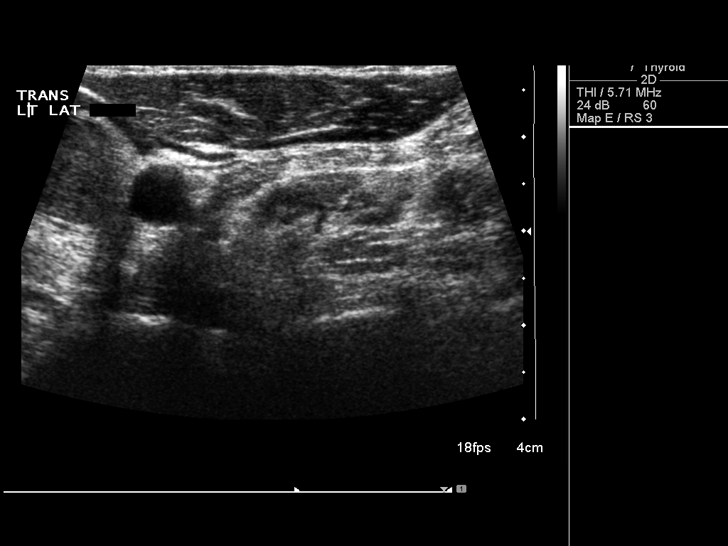

[13 of 25 positions shown; findings below may reference images not displayed]

FINDINGS: Right thyroid lobe

Measurements: 5.0 x 1.1 x 2.5 cm. Multiple small subcentimeter
nodules are identified in the right lobe. The largest solid nodule
measures approximately 1.1 x 0.6 x 1.0 cm. The largest mostly cystic
nodule measures 0.7 cm.

Left thyroid lobe

Measurements: 4.5 x 1.5 x 2.3 cm. Ovoid solid nodule in the mid left
lobe measures approximately 3.0 x 1.0 x 2.5 cm. This nodule is
noncalcified. Tiny subcentimeter scattered nodules are seen
elsewhere in the left lobe.

Isthmus

Thickness: 0.3 cm.  No nodules visualized.

Lymphadenopathy

None visualized.
IMPRESSION: Multinodular thyroid goiter with dominant left thyroid nodule. This
measures approximately 3.0 x 1.0 x 2.5 cm and meets size criteria
for biopsy.

Findings meet consensus criteria for biopsy. Ultrasound-guided fine
needle aspiration should be considered, as per the consensus
statement: Management of Thyroid Nodules Detected at US: Society of
Radiologists in Ultrasound Consensus Conference Statement. Radiology

## 2017-05-23 IMAGING — US US THYROID BIOPSY
1 series · 14 of 15 positions shown · non-contrast
Comparison: 08/01/2014

CLINICAL DATA: Dominant left thyroid hypoechoic mass

EXAM:
ULTRASOUND GUIDED NEEDLE ASPIRATE BIOPSY OF THE THYROID GLAND

[Series 1: us thyroid biopsy · 0.06mm/px · 15 acquisitions, 14 frames shown]
[im 1/15]
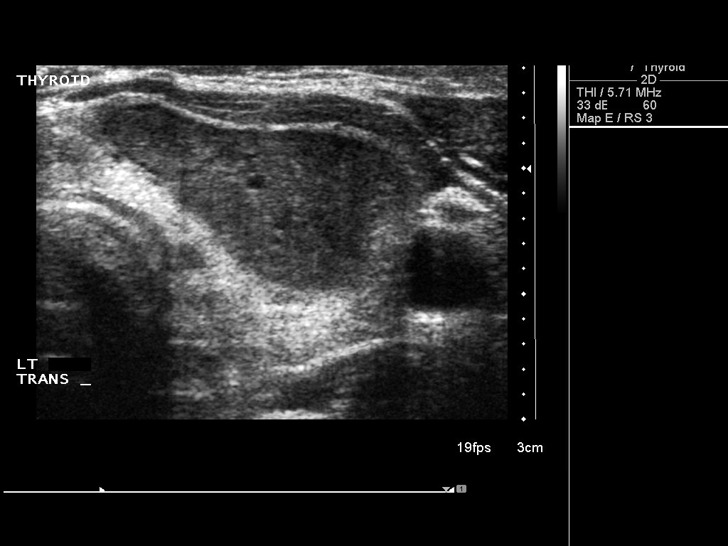
[im 2/15]
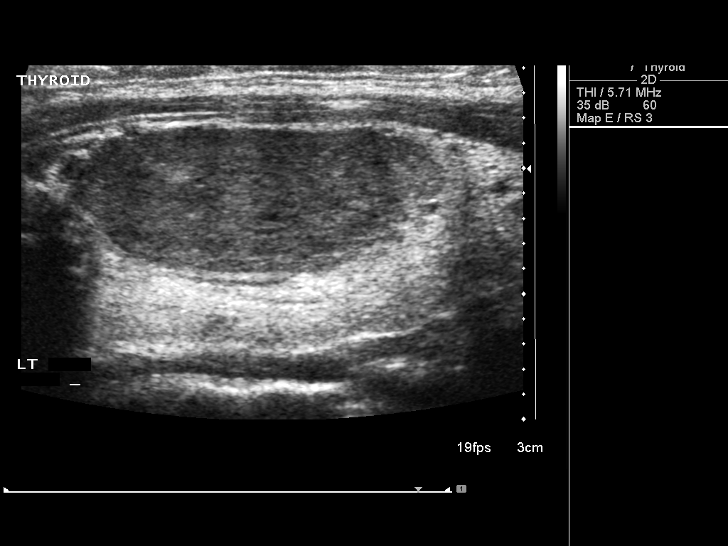
[im 3/15]
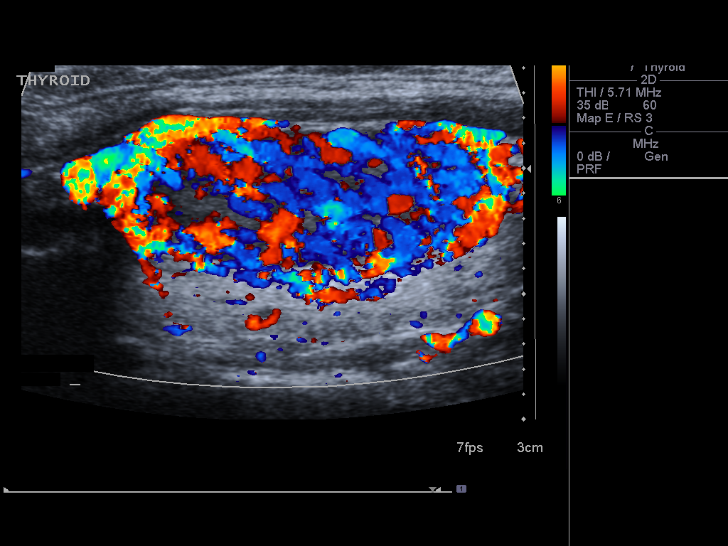
[im 4/15]
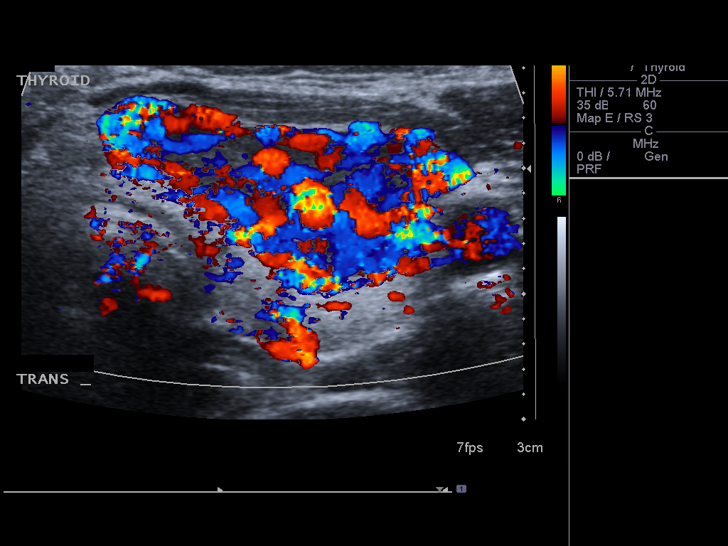
[im 5/15]
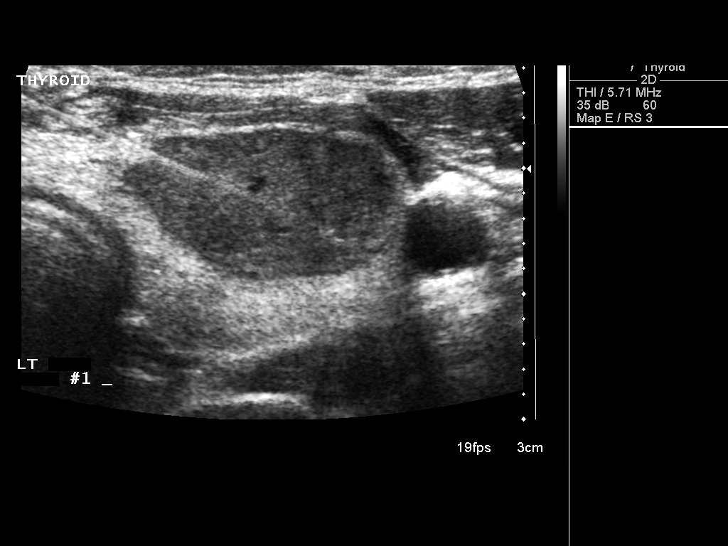
[im 6/15]
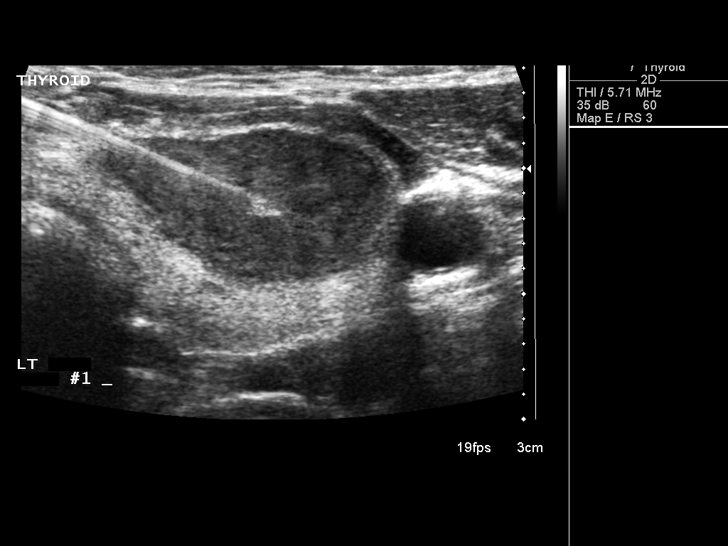
[im 7/15]
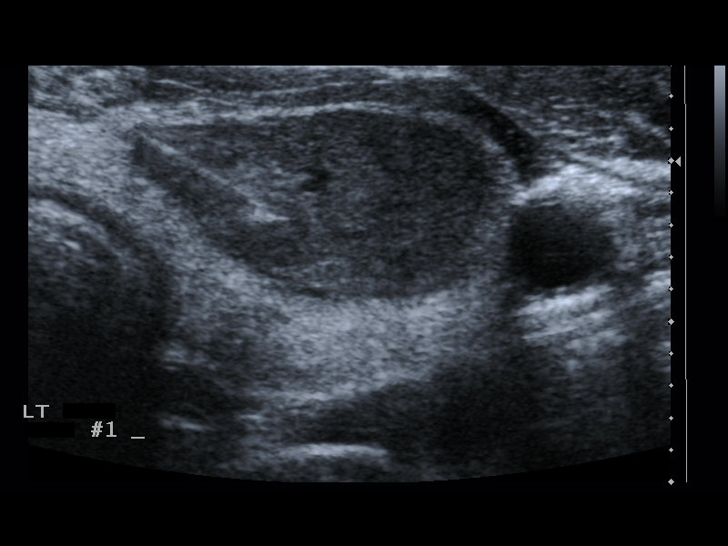
[im 9/15]
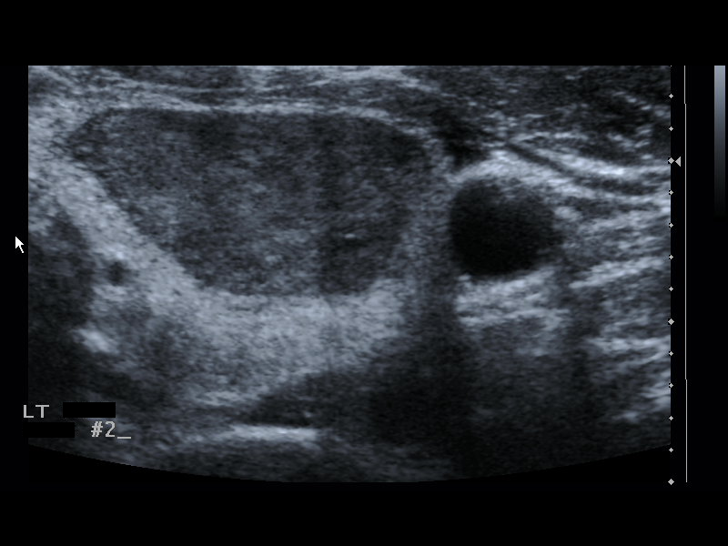
[im 10/15]
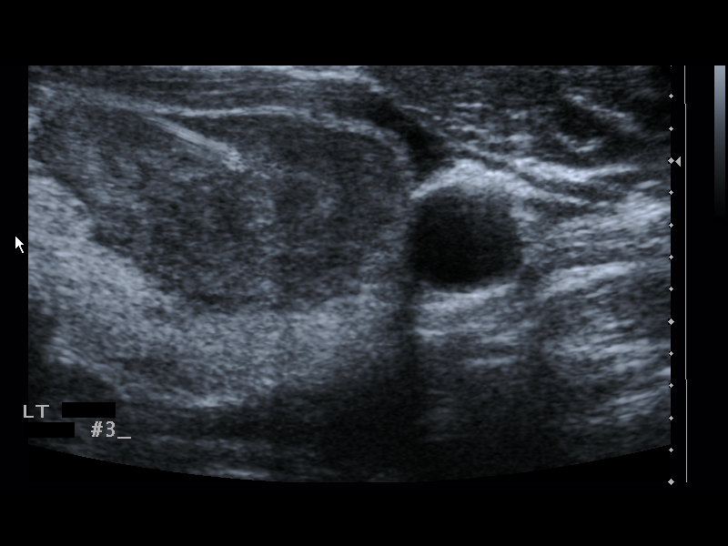
[im 11/15]
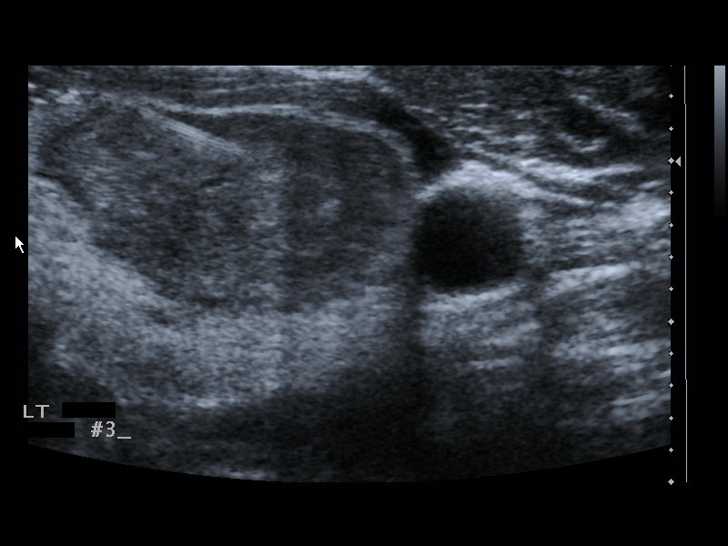
[im 12/15]
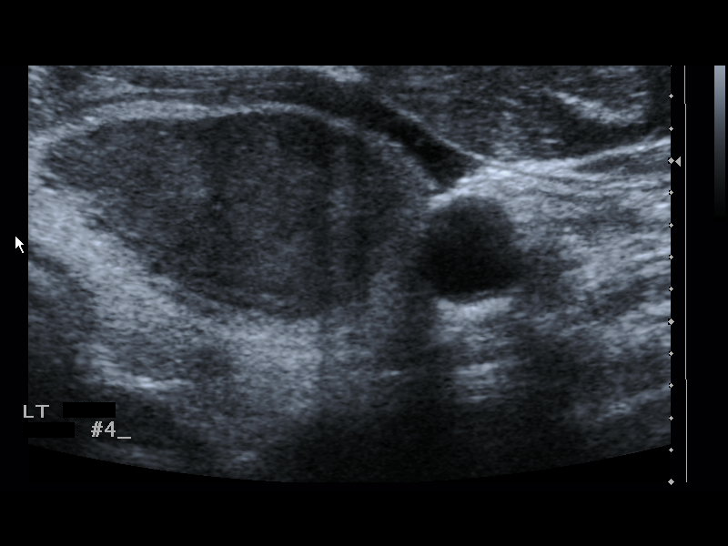
[im 13/15]
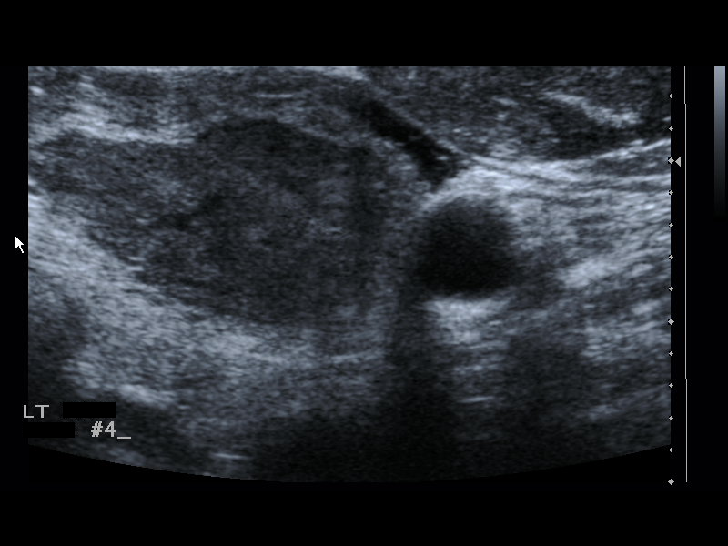
[im 14/15]
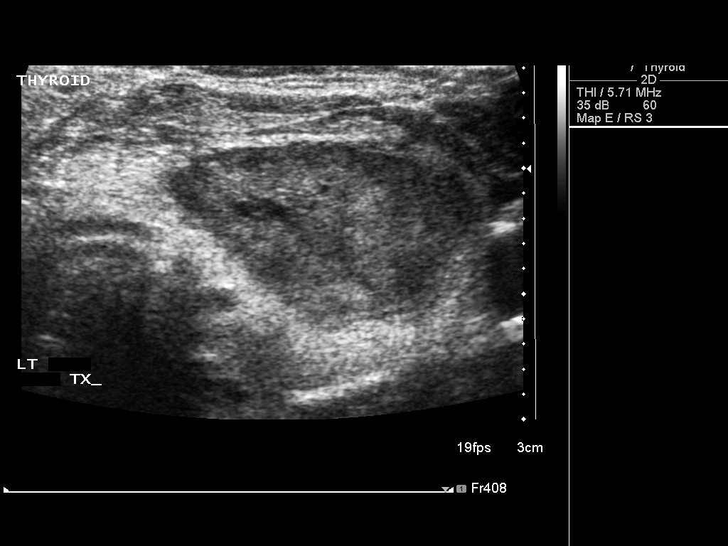
[im 15/15]
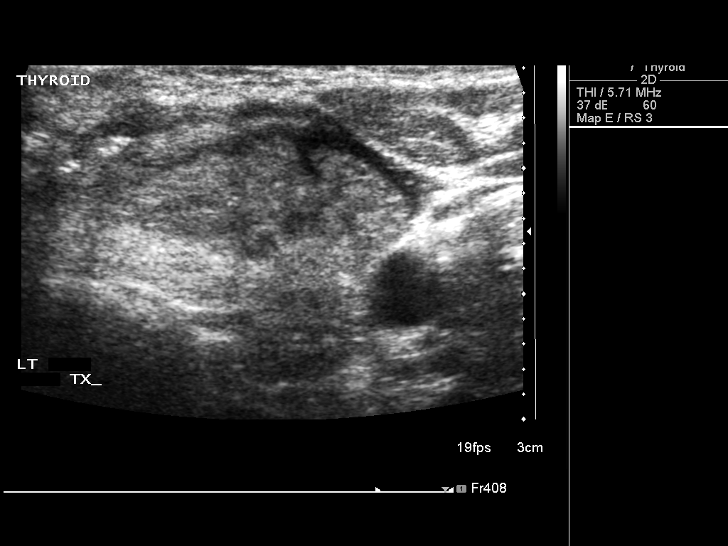

[14 of 15 positions shown; findings below may reference images not displayed]

PROCEDURE:
Thyroid biopsy was thoroughly discussed with the patient and
questions were answered. The benefits, risks, alternatives, and
complications were also discussed. The patient understands and
wishes to proceed with the procedure. Written consent was obtained.



Complications:  None immediate
FINDINGS: Imaging confirms needle placed in the dominant left thyroid mass
IMPRESSION: Ultrasound guided needle aspirate biopsy performed of the dominant
left thyroid mass.

## 2017-06-26 DIAGNOSIS — H35361 Drusen (degenerative) of macula, right eye: Secondary | ICD-10-CM | POA: Diagnosis not present

## 2017-06-27 ENCOUNTER — Ambulatory Visit (INDEPENDENT_AMBULATORY_CARE_PROVIDER_SITE_OTHER): Payer: Medicare HMO | Admitting: Family Medicine

## 2017-06-27 ENCOUNTER — Other Ambulatory Visit: Payer: Self-pay

## 2017-06-27 ENCOUNTER — Encounter: Payer: Self-pay | Admitting: Family Medicine

## 2017-06-27 VITALS — BP 128/60 | HR 68 | Temp 98.5°F | Ht 64.17 in | Wt 163.8 lb

## 2017-06-27 DIAGNOSIS — J069 Acute upper respiratory infection, unspecified: Secondary | ICD-10-CM

## 2017-06-27 DIAGNOSIS — J029 Acute pharyngitis, unspecified: Secondary | ICD-10-CM | POA: Diagnosis not present

## 2017-06-27 LAB — POCT RAPID STREP A (OFFICE): Rapid Strep A Screen: NEGATIVE

## 2017-06-27 NOTE — Patient Instructions (Signed)
     IF you received an x-ray today, you will receive an invoice from [facility]. Please contact [facility] at [phone] with questions or concerns regarding your invoice.   IF you received labwork today, you will receive an invoice from [facility]. Please contact [facility] at [phone] with questions or concerns regarding your invoice.   Our billing staff will not be able to assist you with questions regarding bills from these companies.  You will be contacted with the lab results as soon as they are available. The fastest way to get your results is to activate your My Chart account. Instructions are located on the last page of this paperwork. If you have not heard from us regarding the results in 2 weeks, please contact this office.     

## 2017-06-27 NOTE — Progress Notes (Signed)
Chief Complaint  Patient presents with  . URI    has been coughing for the past 2 wks with no relief. Taking otc with no resolve Mucinex and Sudafed    HPI  Pt has been coughing for 2 weeks  otc meds not helping No fevers or chills Congestion and nonproductive cough No asthma  Does not smoke No recent travel or sick contacts  4 review of systems  Past Medical History:  Diagnosis Date  . Pneumonia    1985 HX OF PNA  . Thyroid nodule     Current Outpatient Medications  Medication Sig Dispense Refill  . b complex vitamins tablet Take 1 tablet by mouth 2 (two) times a week.     . Cholecalciferol (VITAMIN D PO) Take 1 capsule by mouth daily.    Marland Kitchen ibuprofen (ADVIL,MOTRIN) 200 MG tablet Take 200 mg by mouth daily as needed (joint pain).    Marland Kitchen levothyroxine (SYNTHROID, LEVOTHROID) 100 MCG tablet Take 1 tablet (100 mcg total) by mouth daily before breakfast. 30 tablet 3  . calcium carbonate (TUMS - DOSED IN MG ELEMENTAL CALCIUM) 500 MG chewable tablet Chew 1 tablet (200 mg of elemental calcium total) by mouth 3 (three) times daily. (Patient not taking: Reported on 06/27/2017) 30 tablet 3  . CALCIUM PO Take 1 tablet by mouth daily.    Marland Kitchen oxyCODONE (OXY IR/ROXICODONE) 5 MG immediate release tablet Take 1-2 tablets (5-10 mg total) by mouth every 4 (four) hours as needed for moderate pain. (Patient not taking: Reported on 06/27/2017) 30 tablet 0   No current facility-administered medications for this visit.     Allergies:  Allergies  Allergen Reactions  . Bee Venom Anaphylaxis    Past Surgical History:  Procedure Laterality Date  . THYROIDECTOMY  10/11/2014  . THYROIDECTOMY N/A 10/11/2014   Procedure: TOTAL THYROIDECTOMY;  Surgeon: Erroll Luna, MD;  Location: [facility];  Service: General;  Laterality: N/A;    Social History   Socioeconomic History  . Marital status: Married    Spouse name: Not on file  . Number of children: Not on file  . Years of education: Not on file  .  Highest education level: Not on file  Occupational History  . Not on file  Social Needs  . Financial resource strain: Not on file  . Food insecurity:    Worry: Not on file    Inability: Not on file  . Transportation needs:    Medical: Not on file    Non-medical: Not on file  Tobacco Use  . Smoking status: Never Smoker  . Smokeless tobacco: Never Used  Substance and Sexual Activity  . Alcohol use: Yes    Alcohol/week: 1.2 oz    Types: 2 Glasses of wine per week  . Drug use: No  . Sexual activity: Not on file  Lifestyle  . Physical activity:    Days per week: Not on file    Minutes per session: Not on file  . Stress: Not on file  Relationships  . Social connections:    Talks on phone: Not on file    Gets together: Not on file    Attends religious service: Not on file    Active member of club or organization: Not on file    Attends meetings of clubs or organizations: Not on file    Relationship status: Not on file  Other Topics Concern  . Not on file  Social History Narrative  . Not on file  Family History  Problem Relation Age of Onset  . Heart disease Mother   . Stroke Mother   . Cancer Brother      ROS Review of Systems See HPI Constitution: No fevers or chills No malaise No diaphoresis Skin: No rash or itching Eyes: no blurry vision, no double vision GU: no dysuria or hematuria Neuro: no dizziness or headaches * all others reviewed and negative   Objective: Vitals:   06/27/17 1517  BP: 128/60  Pulse: 68  Temp: 98.5 F (36.9 C)  TempSrc: Oral  SpO2: 98%  Weight: 163 lb 12.8 oz (74.3 kg)  Height: 5' 4.17" (1.63 m)    Physical Exam General: alert, oriented, in NAD Head: normocephalic, atraumatic, no sinus tenderness Eyes: EOM intact, no scleral icterus or conjunctival injection Ears: TM clear bilaterally Nose: mucosa nonerythematous, nonedematous Throat: no pharyngeal exudate or erythema Lymph: no posterior auricular, submental or  cervical lymph adenopathy Heart: normal rate, normal sinus rhythm, no murmurs Lungs: clear to auscultation bilaterally, no wheezing Negative strep  Assessment and Plan Pairlee was seen today for uri.  Diagnoses and all orders for this visit:  Streptococcal sore throat -     POCT rapid strep A  Acute URI   Viral uri Supportive care advised   [place]

## 2017-07-14 DIAGNOSIS — Z1211 Encounter for screening for malignant neoplasm of colon: Secondary | ICD-10-CM | POA: Diagnosis not present

## 2017-07-14 DIAGNOSIS — Z01411 Encounter for gynecological examination (general) (routine) with abnormal findings: Secondary | ICD-10-CM | POA: Diagnosis not present

## 2017-07-14 DIAGNOSIS — Z1231 Encounter for screening mammogram for malignant neoplasm of breast: Secondary | ICD-10-CM | POA: Diagnosis not present

## 2017-07-14 DIAGNOSIS — Z6828 Body mass index (BMI) 28.0-28.9, adult: Secondary | ICD-10-CM | POA: Diagnosis not present

## 2017-07-22 DIAGNOSIS — R69 Illness, unspecified: Secondary | ICD-10-CM | POA: Diagnosis not present

## 2017-10-06 DIAGNOSIS — M79645 Pain in left finger(s): Secondary | ICD-10-CM | POA: Diagnosis not present

## 2017-10-06 DIAGNOSIS — M79644 Pain in right finger(s): Secondary | ICD-10-CM | POA: Diagnosis not present

## 2017-10-06 DIAGNOSIS — E785 Hyperlipidemia, unspecified: Secondary | ICD-10-CM | POA: Diagnosis not present

## 2017-10-06 DIAGNOSIS — M79603 Pain in arm, unspecified: Secondary | ICD-10-CM | POA: Diagnosis not present

## 2017-10-06 DIAGNOSIS — Z0001 Encounter for general adult medical examination with abnormal findings: Secondary | ICD-10-CM | POA: Diagnosis not present

## 2017-10-22 DIAGNOSIS — E039 Hypothyroidism, unspecified: Secondary | ICD-10-CM | POA: Diagnosis not present

## 2017-10-29 DIAGNOSIS — Z0001 Encounter for general adult medical examination with abnormal findings: Secondary | ICD-10-CM | POA: Diagnosis not present

## 2017-10-29 DIAGNOSIS — E890 Postprocedural hypothyroidism: Secondary | ICD-10-CM | POA: Diagnosis not present

## 2017-12-24 ENCOUNTER — Other Ambulatory Visit: Payer: Self-pay | Admitting: Family Medicine

## 2017-12-24 ENCOUNTER — Ambulatory Visit
Admission: RE | Admit: 2017-12-24 | Discharge: 2017-12-24 | Disposition: A | Discharge status: Home / Self Care | Payer: Medicare HMO | Source: Ambulatory Visit | Attending: Family Medicine | Admitting: Family Medicine

## 2017-12-24 DIAGNOSIS — R030 Elevated blood-pressure reading, without diagnosis of hypertension: Secondary | ICD-10-CM | POA: Diagnosis not present

## 2017-12-24 DIAGNOSIS — E890 Postprocedural hypothyroidism: Secondary | ICD-10-CM | POA: Diagnosis not present

## 2017-12-24 DIAGNOSIS — R69 Illness, unspecified: Secondary | ICD-10-CM | POA: Diagnosis not present

## 2017-12-24 DIAGNOSIS — M25552 Pain in left hip: Secondary | ICD-10-CM | POA: Diagnosis not present

## 2017-12-24 DIAGNOSIS — E785 Hyperlipidemia, unspecified: Secondary | ICD-10-CM | POA: Diagnosis not present

## 2017-12-24 DIAGNOSIS — T1490XA Injury, unspecified, initial encounter: Secondary | ICD-10-CM

## 2018-01-02 DIAGNOSIS — F4320 Adjustment disorder, unspecified: Secondary | ICD-10-CM | POA: Diagnosis not present

## 2018-01-02 DIAGNOSIS — M25552 Pain in left hip: Secondary | ICD-10-CM | POA: Diagnosis not present

## 2018-01-02 DIAGNOSIS — F411 Generalized anxiety disorder: Secondary | ICD-10-CM | POA: Diagnosis not present

## 2018-01-02 DIAGNOSIS — R69 Illness, unspecified: Secondary | ICD-10-CM | POA: Diagnosis not present

## 2018-01-09 DIAGNOSIS — F4320 Adjustment disorder, unspecified: Secondary | ICD-10-CM | POA: Diagnosis not present

## 2018-01-09 DIAGNOSIS — R69 Illness, unspecified: Secondary | ICD-10-CM | POA: Diagnosis not present

## 2018-01-09 DIAGNOSIS — L089 Local infection of the skin and subcutaneous tissue, unspecified: Secondary | ICD-10-CM | POA: Diagnosis not present

## 2018-01-09 DIAGNOSIS — B372 Candidiasis of skin and nail: Secondary | ICD-10-CM | POA: Diagnosis not present

## 2018-11-18 LAB — HM MAMMOGRAPHY: HM Mammogram: ABNORMAL — AB (ref 0–4)

## 2019-02-24 ENCOUNTER — Encounter: Payer: Self-pay | Admitting: Family Medicine

## 2019-04-15 ENCOUNTER — Ambulatory Visit: Payer: Medicare HMO | Attending: Internal Medicine

## 2019-04-15 DIAGNOSIS — Z23 Encounter for immunization: Secondary | ICD-10-CM

## 2019-04-15 NOTE — Progress Notes (Signed)
   Covid-19 Vaccination Clinic  Name:  Charlene Jacobs    MRN: [id number] DOB: May 14, 1950  04/15/2019  Charlene Jacobs was observed post Covid-19 immunization for 30 minutes based on pre-vaccination screening without incident. She was provided with Vaccine Information Sheet and instruction to access the V-Safe system.   Charlene Jacobs was instructed to call 911 with any severe reactions post vaccine: Marland Kitchen Difficulty breathing  . Swelling of face and throat  . A fast heartbeat  . A bad rash all over body  . Dizziness and weakness   Immunizations Administered    Name Date Dose VIS Date Route   Pfizer COVID-19 Vaccine 04/15/2019  4:31 PM 0.3 mL 01/01/2019 Intramuscular   Manufacturer: [place]   Lot: [id number]   [place]: [id number]

## 2019-05-10 ENCOUNTER — Ambulatory Visit: Payer: Medicare HMO

## 2019-05-25 ENCOUNTER — Ambulatory Visit: Payer: Medicare HMO | Attending: Internal Medicine

## 2019-05-25 DIAGNOSIS — Z23 Encounter for immunization: Secondary | ICD-10-CM

## 2019-05-25 NOTE — Progress Notes (Signed)
   Covid-19 Vaccination Clinic  Name:  Charlene Jacobs    MRN: [id number] DOB: 1950-07-09  05/25/2019  Charlene Jacobs was observed post Covid-19 immunization for 15 minutes without incident. She was provided with Vaccine Information Sheet and instruction to access the V-Safe system.   Charlene Jacobs was instructed to call 911 with any severe reactions post vaccine: Marland Kitchen Difficulty breathing  . Swelling of face and throat  . A fast heartbeat  . A bad rash all over body  . Dizziness and weakness   Immunizations Administered    Name Date Dose VIS Date Route   Pfizer COVID-19 Vaccine 05/25/2019 11:48 AM 0.3 mL 03/17/2018 Intramuscular   Manufacturer: [place]   Lot: [id number]   [place]: [id number]

## 2022-04-12 ENCOUNTER — Other Ambulatory Visit: Payer: Self-pay | Admitting: Obstetrics and Gynecology

## 2022-04-12 DIAGNOSIS — Z13820 Encounter for screening for osteoporosis: Secondary | ICD-10-CM

## 2022-06-21 ENCOUNTER — Encounter (HOSPITAL_BASED_OUTPATIENT_CLINIC_OR_DEPARTMENT_OTHER): Payer: Self-pay

## 2022-06-21 DIAGNOSIS — E785 Hyperlipidemia, unspecified: Secondary | ICD-10-CM | POA: Insufficient documentation

## 2022-06-21 DIAGNOSIS — D751 Secondary polycythemia: Secondary | ICD-10-CM | POA: Insufficient documentation

## 2022-06-21 DIAGNOSIS — B372 Candidiasis of skin and nail: Secondary | ICD-10-CM | POA: Insufficient documentation

## 2022-06-21 DIAGNOSIS — L519 Erythema multiforme, unspecified: Secondary | ICD-10-CM | POA: Insufficient documentation

## 2022-06-21 DIAGNOSIS — F419 Anxiety disorder, unspecified: Secondary | ICD-10-CM | POA: Insufficient documentation

## 2022-06-21 DIAGNOSIS — F39 Unspecified mood [affective] disorder: Secondary | ICD-10-CM | POA: Insufficient documentation

## 2022-06-21 DIAGNOSIS — N183 Chronic kidney disease, stage 3 unspecified: Secondary | ICD-10-CM | POA: Insufficient documentation

## 2022-06-21 DIAGNOSIS — R051 Acute cough: Secondary | ICD-10-CM | POA: Insufficient documentation

## 2022-06-21 DIAGNOSIS — I4819 Other persistent atrial fibrillation: Secondary | ICD-10-CM | POA: Insufficient documentation

## 2022-06-21 DIAGNOSIS — M545 Low back pain, unspecified: Secondary | ICD-10-CM | POA: Insufficient documentation

## 2022-06-21 DIAGNOSIS — F4320 Adjustment disorder, unspecified: Secondary | ICD-10-CM | POA: Insufficient documentation

## 2022-06-21 DIAGNOSIS — E039 Hypothyroidism, unspecified: Secondary | ICD-10-CM | POA: Insufficient documentation

## 2022-06-21 DIAGNOSIS — N182 Chronic kidney disease, stage 2 (mild): Secondary | ICD-10-CM | POA: Insufficient documentation

## 2022-06-24 ENCOUNTER — Encounter (HOSPITAL_BASED_OUTPATIENT_CLINIC_OR_DEPARTMENT_OTHER): Payer: Self-pay | Admitting: Cardiology

## 2022-06-24 ENCOUNTER — Ambulatory Visit (HOSPITAL_BASED_OUTPATIENT_CLINIC_OR_DEPARTMENT_OTHER): Payer: Medicare HMO | Admitting: Cardiology

## 2022-06-24 VITALS — BP 118/72 | HR 79 | Ht 64.1 in | Wt 155.0 lb

## 2022-06-24 DIAGNOSIS — I4819 Other persistent atrial fibrillation: Secondary | ICD-10-CM | POA: Diagnosis not present

## 2022-06-24 DIAGNOSIS — D6869 Other thrombophilia: Secondary | ICD-10-CM | POA: Diagnosis not present

## 2022-06-24 DIAGNOSIS — Z7901 Long term (current) use of anticoagulants: Secondary | ICD-10-CM | POA: Diagnosis not present

## 2022-06-24 DIAGNOSIS — Z7189 Other specified counseling: Secondary | ICD-10-CM

## 2022-06-24 DIAGNOSIS — Z712 Person consulting for explanation of examination or test findings: Secondary | ICD-10-CM

## 2022-06-24 NOTE — Patient Instructions (Signed)
Medication Instructions:  Your physician recommends that you continue on your current medications as directed. Please refer to the Current Medication list given to you today.  *If you need a refill on your cardiac medications before your next appointment, please call your pharmacy*  Lab Work: NONE  Testing/Procedures: Your physician has requested that you have an echocardiogram. Echocardiography is a painless test that uses sound waves to create images of your heart. It provides your doctor with information about the size and shape of your heart and how well your heart's chambers and valves are working. This procedure takes approximately one hour. There are no restrictions for this procedure. Please do NOT wear cologne, perfume, aftershave, or lotions (deodorant is allowed). Please arrive 15 minutes prior to your appointment time.  Follow-Up: At [facility], you and your health needs are our priority.  As part of our continuing mission to provide you with exceptional heart care, we have created designated Provider Care Teams.  These Care Teams include your primary Cardiologist (physician) and Advanced Practice Providers (APPs -  Physician Assistants and Nurse Practitioners) who all work together to provide you with the care you need, when you need it.  We recommend signing up for the patient portal called "MyChart".  Sign up information is provided on this After Visit Summary.  MyChart is used to connect with patients for Virtual Visits (Telemedicine).  Patients are able to view lab/test results, encounter notes, upcoming appointments, etc.  Non-urgent messages can be sent to your provider as well.   To learn more about what you can do with MyChart, go to [email or web address].    Your next appointment:   2 month(s)  The format for your next appointment:   In Person  Provider:   Jodelle Red, MD or Ronn Melena NP

## 2022-06-24 NOTE — Progress Notes (Signed)
Cardiology Office Note:    Date:  06/24/2022   ID:  Charlene Jacobs, DOB [birth year], MRN [id number]  PCP:  Pcp, No  Cardiologist:  Jodelle Red, MD  Referring MD: Dois Davenport, MD   CC: New patient evaluation for new onset atrial fibrillation  History of Present Illness:    Charlene Jacobs is a 72 y.o. female with a hx of atrial fibrillation, pneumonia, and thyroid nodule, who is seen as a new consult at the request of Dois Davenport, MD for the evaluation and management of new onset atrial fibrillation.  Referral notes from Dr. Hal Hope personally reviewed. She was seen 05/02/2022 and was found to have an irregular heart rhythm noted on auscultation. Her EKG confirmed new onset atrial fibrillation, poor R wave progression, nonspecific T wave changes. Xarelto 20 mg daily and metoprolol 25 mg daily were initiated.  Cardiovascular risk factors: Prior clinical ASCVD: None. She presents a picture copy of her EKG from her primary care visit. On personal review, this appears consistent with atrial fibrillation. She confirms this was the first time she had known about atrial fibrillation. Her only associated symptom is "extreme tiredness" which initially began in 01/2022 while she was assisting her daughter with babysitting. She believes she may have been in Afib since then but she isn't certain. Currently she is still needing to rest during the day, may or may not fall asleep. By the evening she is ready to sit down and relax/read. She has now been on Xarelto for over a month. She denies missing any doses. No bleeding issues. Comorbid conditions: She denies hyperlipidemia, diabetes. Metabolic syndrome/Obesity: Current weight is 155 lbs. Chronic inflammatory conditions: None Tobacco use history: Never Family history: Her mother had CHF. Her father was on digitalis, died at 31 yo. Prior cardiac testing and/or incidental findings on other testing: None. Exercise level: She states  that she did not experience any chest pains until she started thinking about it. When the pain occurs it is localized to her right chest and comes and goes. She notes that she has been lifting weights for exercise. She thinks the chest pain may be musculoskeletal; it does feel better with rubbing the area. Current diet: Two cups of 1/2 caffeinated beverages in the morning.   She denies any shortness of breath, peripheral edema, lightheadedness, headaches, syncope, orthopnea, or PND.  Past Medical History:  Diagnosis Date   Pneumonia    1985 HX OF PNA   Thyroid nodule     Past Surgical History:  Procedure Laterality Date   THYROIDECTOMY  10/11/2014   THYROIDECTOMY N/A 10/11/2014   Procedure: TOTAL THYROIDECTOMY;  Surgeon: Harriette Bouillon, MD;  Location: [facility];  Service: General;  Laterality: N/A;    Current Medications: Current Outpatient Medications on File Prior to Visit  Medication Sig   Cholecalciferol (VITAMIN D PO) Take 1 capsule by mouth daily.   ibuprofen (ADVIL,MOTRIN) 200 MG tablet Take 200 mg by mouth daily as needed (joint pain).   levothyroxine (SYNTHROID) 112 MCG tablet Take 112 mcg by mouth daily before breakfast.   metoprolol succinate (TOPROL-XL) 25 MG 24 hr tablet Take 25 mg by mouth daily.   rivaroxaban (XARELTO) 20 MG TABS tablet Take 20 mg by mouth daily with supper.   traZODone (DESYREL) 50 MG tablet Take 25-50 mg by mouth at bedtime.   No current facility-administered medications on file prior to visit.     Allergies:   Bee venom   Social History   Tobacco  Use   Smoking status: Never   Smokeless tobacco: Never  Substance Use Topics   Alcohol use: Yes    Alcohol/week: 2.0 standard drinks of alcohol    Types: 2 Glasses of wine per week   Drug use: No    Family History: family history includes Cancer in her brother; Heart disease in her mother; Stroke in her mother.  ROS:   Please see the history of present illness.  Additional pertinent  ROS: Constitutional: Negative for chills, fever, night sweats, unintentional weight loss. Positive for fatigue.  HENT: Negative for ear pain and hearing loss.   Eyes: Negative for loss of vision and eye pain.  Respiratory: Negative for cough, sputum, wheezing.   Cardiovascular: See HPI. Gastrointestinal: Negative for abdominal pain, melena, and hematochezia.  Genitourinary: Negative for dysuria and hematuria.  Musculoskeletal: Negative for falls and myalgias.  Skin: Negative for itching and rash.  Neurological: Negative for focal weakness, focal sensory changes and loss of consciousness.  Endo/Heme/Allergies: Does not bruise/bleed easily.     EKGs/Labs/Other Studies Reviewed:    The following studies were reviewed today:  Chest X-Ray  10/11/2014: FINDINGS: Lungs are clear.  No pleural effusion or pneumothorax.   The heart is normal in size.   Mild degenerative changes of the visualized thoracolumbar spine.   IMPRESSION: No evidence of acute cardiopulmonary disease.  EKG:  EKG is personally reviewed.   06/24/2022:  atrial fibrillation at 79 bpm  Recent Labs: No results found for requested labs within last 365 days.   Recent Lipid Panel No results found for: "CHOL", "TRIG", "HDL", "CHOLHDL", "VLDL", "LDLCALC", "LDLDIRECT"  Physical Exam:    VS:  BP 118/72 (BP Location: Left Arm, Patient Position: Sitting, Cuff Size: Normal)   Pulse 79   Ht 5' 4.1" (1.628 m)   Wt 155 lb (70.3 kg)   BMI 26.52 kg/m     Wt Readings from Last 3 Encounters:  06/24/22 155 lb (70.3 kg)  06/27/17 163 lb 12.8 oz (74.3 kg)  10/11/14 166 lb (75.3 kg)    GEN: Well nourished, well developed in no acute distress HEENT: Normal, moist mucous membranes NECK: No JVD CARDIAC: Irregularly irregular rhythm, normal S1 and S2, no rubs or gallops. No murmur. VASCULAR: Radial and DP pulses 2+ bilaterally. No carotid bruits RESPIRATORY:  Clear to auscultation without rales, wheezing or rhonchi  ABDOMEN:  Soft, non-tender, non-distended MUSCULOSKELETAL:  Ambulates independently SKIN: Warm and dry, no edema NEUROLOGIC:  Alert and oriented x 3. No focal neuro deficits noted. PSYCHIATRIC:  Normal affect    ASSESSMENT:    1. Persistent atrial fibrillation (HCC)   2. Secondary hypercoagulable state (HCC)   3. Long term current use of anticoagulant   4. Encounter to discuss test results   5. Cardiac risk counseling   6. Counseling on health promotion and disease prevention    PLAN:    Persistent atrial fibrillation -Afib is abnormal rhythm of the top chamber of the heart. It can be triggered by many different things, including stress, infection, surgery, age, etc. The rhythm itself, when not fast, is not life threatening. We make sure the heart rate is not fast, and we talk about the risk of small blood clots forming in the heart. When afib lasts for >48 hours, there is a risk of small clots forming and breaking loose, causing a stroke. We prevent this with blood thinning medication, but these medications also increase risk for bleeding. We discuss the balance between bleeding and stroke with  each individual patient.  -reviewed her ECG (current and prior) today -CHA2DS2/VAS Stroke Risk Points= 2. Tolerating Xarelto -rate controlled with metoprolol -discussed cardioversion, echo today. She would like to have echo done to make sure there isn't anything else that might explain her afib. If not, she will consider cardioversion. -symptom is fatigue  Cardiac risk counseling and prevention recommendations: -recommend heart healthy/Mediterranean diet, with whole grains, fruits, vegetable, fish, lean meats, nuts, and olive oil. Limit salt. -recommend moderate walking, 3-5 times/week for 30-50 minutes each session. Aim for at least 150 minutes.week. Goal should be pace of 3 miles/hours, or walking 1.5 miles in 30 minutes -recommend avoidance of tobacco products. Avoid excess alcohol. -ASCVD risk  score: The ASCVD Risk score (Arnett DK, et al., 2019) failed to calculate for the following reasons:   Cannot find a previous HDL lab   Cannot find a previous total cholesterol lab    Plan for follow up: 2 months or sooner as needed.  Jodelle Red, MD, PhD, [facility] [place]  [facility]    Medication Adjustments/Labs and Tests Ordered: Current medicines are reviewed at length with the patient today.  Concerns regarding medicines are outlined above.   Orders Placed This Encounter  Procedures   EKG 12-Lead   ECHOCARDIOGRAM COMPLETE   No orders of the defined types were placed in this encounter.  Patient Instructions  Medication Instructions:  Your physician recommends that you continue on your current medications as directed. Please refer to the Current Medication list given to you today.  *If you need a refill on your cardiac medications before your next appointment, please call your pharmacy*  Lab Work: NONE  Testing/Procedures: Your physician has requested that you have an echocardiogram. Echocardiography is a painless test that uses sound waves to create images of your heart. It provides your doctor with information about the size and shape of your heart and how well your heart's chambers and valves are working. This procedure takes approximately one hour. There are no restrictions for this procedure. Please do NOT wear cologne, perfume, aftershave, or lotions (deodorant is allowed). Please arrive 15 minutes prior to your appointment time.  Follow-Up: At [facility], you and your health needs are our priority.  As part of our continuing mission to provide you with exceptional heart care, we have created designated Provider Care Teams.  These Care Teams include your primary Cardiologist (physician) and Advanced Practice Providers (APPs -  Physician Assistants and Nurse Practitioners) who all work together to provide you with the care you need, when you need  it.  We recommend signing up for the patient portal called "MyChart".  Sign up information is provided on this After Visit Summary.  MyChart is used to connect with patients for Virtual Visits (Telemedicine).  Patients are able to view lab/test results, encounter notes, upcoming appointments, etc.  Non-urgent messages can be sent to your provider as well.   To learn more about what you can do with MyChart, go to [email or web address].    Your next appointment:   2 month(s)  The format for your next appointment:   In Person  Provider:   Jodelle Red, MD or Ronn Melena NP      I,Mathew Stumpf,acting as a scribe for Jodelle Red, MD.,have documented all relevant documentation on the behalf of Jodelle Red, MD,as directed by  Jodelle Red, MD while in the presence of Jodelle Red, MD.  I, Jodelle Red, MD, have reviewed all documentation for this visit. The documentation on  06/24/22 for the exam, diagnosis, procedures, and orders are all accurate and complete.   Signed, Jodelle Red, MD PhD 06/24/2022 6:39 PM    [facility]

## 2022-07-24 ENCOUNTER — Ambulatory Visit (INDEPENDENT_AMBULATORY_CARE_PROVIDER_SITE_OTHER): Payer: Medicare HMO

## 2022-07-24 DIAGNOSIS — I4819 Other persistent atrial fibrillation: Secondary | ICD-10-CM | POA: Diagnosis not present

## 2022-07-24 DIAGNOSIS — I517 Cardiomegaly: Secondary | ICD-10-CM

## 2022-07-24 DIAGNOSIS — I081 Rheumatic disorders of both mitral and tricuspid valves: Secondary | ICD-10-CM

## 2022-07-24 DIAGNOSIS — I4891 Unspecified atrial fibrillation: Secondary | ICD-10-CM

## 2022-07-25 LAB — ECHOCARDIOGRAM COMPLETE
Area-P 1/2: 4.21 cm2
MV M vel: 4.66 m/s
MV Peak grad: 86.9 mmHg
P 1/2 time: 422 msec
Radius: 0.3 cm
S' Lateral: 1.89 cm

## 2022-09-05 ENCOUNTER — Ambulatory Visit (HOSPITAL_BASED_OUTPATIENT_CLINIC_OR_DEPARTMENT_OTHER): Payer: Medicare HMO | Admitting: Cardiology

## 2022-09-05 ENCOUNTER — Encounter (HOSPITAL_BASED_OUTPATIENT_CLINIC_OR_DEPARTMENT_OTHER): Payer: Self-pay | Admitting: Cardiology

## 2022-09-05 VITALS — BP 112/74 | HR 78 | Ht 62.0 in | Wt 154.0 lb

## 2022-09-05 DIAGNOSIS — D6869 Other thrombophilia: Secondary | ICD-10-CM

## 2022-09-05 DIAGNOSIS — Z712 Person consulting for explanation of examination or test findings: Secondary | ICD-10-CM | POA: Diagnosis not present

## 2022-09-05 DIAGNOSIS — E785 Hyperlipidemia, unspecified: Secondary | ICD-10-CM

## 2022-09-05 DIAGNOSIS — I4819 Other persistent atrial fibrillation: Secondary | ICD-10-CM | POA: Diagnosis not present

## 2022-09-05 DIAGNOSIS — Z7901 Long term (current) use of anticoagulants: Secondary | ICD-10-CM

## 2022-09-05 NOTE — Progress Notes (Signed)
Cardiology Office Note:  .    Date:  09/05/2022  ID:  Charlene Jacobs, DOB 08-Jan-1951, MRN [id number] PCP: Dois Davenport, MD  [facility] Providers Cardiologist:  Jodelle Red, MD     History of Present Illness: Charlene Jacobs is a 72 y.o. female with a hx of atrial fibrillation, pneumonia, and thyroid nodule, who is seen for follow-up today. She was initially seen 06/2022 as a new consult at the request of Dois Davenport, MD for the evaluation and management of new onset atrial fibrillation.   CV history: Diagnosed with afib 05/02/2022, started on Xarelto 20 mg daily and metoprolol 25 mg daily by PCP and referred to cardiology.  Family history: Her mother had CHF. Her father was on digitalis, died at 90 yo.  At her initial visit we discussed cardioversion, echocardiogram. She preferred to pursue echocardiogram which was completed 07/2022 revealing LVEF 55-60%, abnormal global longitudinal strain -14.7%, mild to moderate tricuspid valve regurgitation, mild mitral regurgitation.   Today, she reports that her fatigue has improved significantly. She is back to work part-time. She stays active with swimming and spending time with her grandchildren. For exercise she is also walking for 30 minutes about 3 times weekly. While walking she will try to monitor her pulse, and is not always certain if she is in atrial fibrillation. Of note, she states she was started on rosuvastatin 10 mg daily by her PCP due to elevated LDL. She complains of nocturnal LE muscle cramps. She denies any palpitations, chest pain, shortness of breath, peripheral edema, lightheadedness, headaches, syncope, orthopnea, or PND.  ROS:  Please see the history of present illness. ROS otherwise negative except as noted.  (+) Nocturnal LE muscle cramps  Studies Reviewed: .         Echo  07/24/2022:  1. Left ventricular ejection fraction, by estimation, is 55 to 60%. The  left ventricle has normal  function. The left ventricle has no regional  wall motion abnormalities. Left ventricular diastolic parameters are  indeterminate. The average left  ventricular global longitudinal strain is -14.7 %. The global longitudinal  strain is abnormal.   2. Right ventricular systolic function is normal. The right ventricular  size is normal. There is normal pulmonary artery systolic pressure.   3. Left atrial size was mildly dilated.   4. Right atrial size was mildly dilated.   5. The mitral valve is grossly normal. Mild mitral valve regurgitation.  No evidence of mitral stenosis.   6. Tricuspid valve regurgitation is mild to moderate.   7. The aortic valve was not well visualized. Aortic valve regurgitation  is not visualized. No aortic stenosis is present.   8. The inferior vena cava is normal in size with greater than 50%  respiratory variability, suggesting right atrial pressure of 3 mmHg.   Comparison(s): No prior Echocardiogram.   Conclusion(s)/Recommendation(s): Otherwise normal echocardiogram, with  minor abnormalities described in the report.   Physical Exam:    VS:  BP 112/74   Pulse 78   Ht 5\' 2"  (1.575 m)   Wt 154 lb (69.9 kg)   SpO2 98%   BMI 28.17 kg/m    Wt Readings from Last 3 Encounters:  09/05/22 154 lb (69.9 kg)  06/24/22 155 lb (70.3 kg)  06/27/17 163 lb 12.8 oz (74.3 kg)    GEN: Well nourished, well developed in no acute distress HEENT: Normal, moist mucous membranes NECK: No JVD CARDIAC:Irregularly irregular, normal S1 and  S2, no rubs or gallops. No murmur. VASCULAR: Radial and DP pulses 2+ bilaterally. No carotid bruits RESPIRATORY:  Clear to auscultation without rales, wheezing or rhonchi  ABDOMEN: Soft, non-tender, non-distended MUSCULOSKELETAL:  Ambulates independently SKIN: Warm and dry, no edema NEUROLOGIC:  Alert and oriented x 3. No focal neuro deficits noted. PSYCHIATRIC:  Normal affect   ASSESSMENT AND PLAN: .    Persistent atrial  fibrillation -discussed cardioversion again today, at length. As she is feeling well, would like to defer this at this time. Will contact me if symptoms develop. -CHA2DS2/VAS Stroke Risk Points= 2. Tolerating Xarelto -rate controlled with metoprolol -reviewed echo today -symptom is fatigue  Hyperlipidemia -I cannot see her labs, but she reports being started on rosuvastatin 10 mg daily for hyperlipidemia recently.    Cardiac risk counseling and prevention recommendations: -recommend heart healthy/Mediterranean diet, with whole grains, fruits, vegetable, fish, lean meats, nuts, and olive oil. Limit salt. -recommend moderate walking, 3-5 times/week for 30-50 minutes each session. Aim for at least 150 minutes.week. Goal should be pace of 3 miles/hours, or walking 1.5 miles in 30 minutes -recommend avoidance of tobacco products. Avoid excess alcohol.  Dispo: Follow-up in 6 months, or sooner as needed.  I,Mathew Stumpf,acting as a Neurosurgeon for [employer], MD.,have documented all relevant documentation on the behalf of Jodelle Red, MD,as directed by  Jodelle Red, MD while in the presence of Jodelle Red, MD.  I, Jodelle Red, MD, have reviewed all documentation for this visit. The documentation on 09/05/22 for the exam, diagnosis, procedures, and orders are all accurate and complete.   Signed, Jodelle Red, MD

## 2022-09-05 NOTE — Patient Instructions (Signed)
Medication Instructions:  Continue current medications  *If you need a refill on your cardiac medications before your next appointment, please call your pharmacy*   Lab Work: None Ordered   Testing/Procedures: None Ordered   Follow-Up: At [facility], you and your health needs are our priority.  As part of our continuing mission to provide you with exceptional heart care, we have created designated Provider Care Teams.  These Care Teams include your primary Cardiologist (physician) and Advanced Practice Providers (APPs -  Physician Assistants and Nurse Practitioners) who all work together to provide you with the care you need, when you need it.  We recommend signing up for the patient portal called "MyChart".  Sign up information is provided on this After Visit Summary.  MyChart is used to connect with patients for Virtual Visits (Telemedicine).  Patients are able to view lab/test results, encounter notes, upcoming appointments, etc.  Non-urgent messages can be sent to your provider as well.   To learn more about what you can do with MyChart, go to [email or web address].    Your next appointment:   6 month(s)  Provider:   Buford Dresser, MD    Other Instructions

## 2022-10-18 ENCOUNTER — Ambulatory Visit
Admission: RE | Admit: 2022-10-18 | Discharge: 2022-10-18 | Disposition: A | Discharge status: Home / Self Care | Payer: Medicare HMO | Source: Ambulatory Visit | Attending: Obstetrics and Gynecology | Admitting: Obstetrics and Gynecology

## 2022-10-18 DIAGNOSIS — Z13820 Encounter for screening for osteoporosis: Secondary | ICD-10-CM

## 2023-03-03 ENCOUNTER — Other Ambulatory Visit: Payer: Self-pay | Admitting: Obstetrics and Gynecology

## 2023-03-03 DIAGNOSIS — Z1231 Encounter for screening mammogram for malignant neoplasm of breast: Secondary | ICD-10-CM

## 2023-03-12 ENCOUNTER — Encounter (HOSPITAL_BASED_OUTPATIENT_CLINIC_OR_DEPARTMENT_OTHER): Payer: Self-pay | Admitting: Cardiology

## 2023-03-12 ENCOUNTER — Ambulatory Visit (HOSPITAL_BASED_OUTPATIENT_CLINIC_OR_DEPARTMENT_OTHER): Payer: Medicare HMO | Admitting: Cardiology

## 2023-03-12 VITALS — BP 118/88 | HR 65 | Ht 62.0 in | Wt 158.2 lb

## 2023-03-12 DIAGNOSIS — E7800 Pure hypercholesterolemia, unspecified: Secondary | ICD-10-CM

## 2023-03-12 DIAGNOSIS — D6869 Other thrombophilia: Secondary | ICD-10-CM | POA: Diagnosis not present

## 2023-03-12 DIAGNOSIS — Z7901 Long term (current) use of anticoagulants: Secondary | ICD-10-CM

## 2023-03-12 DIAGNOSIS — Z7189 Other specified counseling: Secondary | ICD-10-CM

## 2023-03-12 DIAGNOSIS — I4819 Other persistent atrial fibrillation: Secondary | ICD-10-CM

## 2023-03-12 NOTE — Patient Instructions (Signed)
Medication Instructions:  Your physician recommends that you continue on your current medications as directed. Please refer to the Current Medication list given to you today.  *If you need a refill on your cardiac medications before your next appointment, please call your pharmacy*   Follow-Up: At [facility], you and your health needs are our priority.  As part of our continuing mission to provide you with exceptional heart care, we have created designated Provider Care Teams.  These Care Teams include your primary Cardiologist (physician) and Advanced Practice Providers (APPs -  Physician Assistants and Nurse Practitioners) who all work together to provide you with the care you need, when you need it.  We recommend signing up for the patient portal called "MyChart".  Sign up information is provided on this After Visit Summary.  MyChart is used to connect with patients for Virtual Visits (Telemedicine).  Patients are able to view lab/test results, encounter notes, upcoming appointments, etc.  Non-urgent messages can be sent to your provider as well.   To learn more about what you can do with MyChart, go to [email or web address].    Your next appointment:   1 year   Provider:   Jodelle Red, MD

## 2023-03-12 NOTE — Progress Notes (Signed)
  Cardiology Office Note:  .    Date:  03/12/2023  ID:  Charlene Jacobs, DOB 06-09-1950, MRN [id number] PCP: Dois Davenport, MD  [facility] Providers Cardiologist:  Jodelle Red, MD     History of Present Illness: Charlene Jacobs is a 73 y.o. female with a hx of atrial fibrillation, pneumonia, and thyroid nodule, who is seen for follow-up today. She was initially seen 06/2022 as a new consult at the request of Dois Davenport, MD for the evaluation and management of new onset atrial fibrillation.   CV history: Diagnosed with afib 05/02/2022, started on Xarelto 20 mg daily and metoprolol 25 mg daily by PCP and referred to cardiology.  Family history: Her mother had CHF. Her father was on digitalis, died at 59 yo.  At her initial visit we discussed cardioversion, echocardiogram. She preferred to pursue echocardiogram which was completed 07/2022 revealing LVEF 55-60%, abnormal global longitudinal strain -14.7%, mild to moderate tricuspid valve regurgitation, mild mitral regurgitation.   Today: Overall doing well. Has been checking BP at home, has been 110s-120s/70s. PCP visit in January did not hear afib. She never feels afib. Energy level is at baseline. Walks regularly without limitations, at least 45 minutes at a time at a good pace.  Denies chest pain, shortness of breath at rest or with normal exertion. No PND, orthopnea, LE edema or unexpected weight gain. No syncope or palpitations. ROS otherwise negative except as noted.   ROS:  Please see the history of present illness. ROS otherwise negative except as noted.   Studies Reviewed: Marland Kitchen         Physical Exam:    VS:  BP 118/88   Pulse 65   Ht 5\' 2"  (1.575 m)   Wt 158 lb 3.2 oz (71.8 kg)   SpO2 98%   BMI 28.94 kg/m    Wt Readings from Last 3 Encounters:  03/12/23 158 lb 3.2 oz (71.8 kg)  09/05/22 154 lb (69.9 kg)  06/24/22 155 lb (70.3 kg)    GEN: Well nourished, well developed in no acute  distress HEENT: Normal, moist mucous membranes NECK: No JVD CARDIAC: irregularly irregular rhythm, normal S1 and S2, no rubs or gallops. No murmur. VASCULAR: Radial and DP pulses 2+ bilaterally. No carotid bruits RESPIRATORY:  Clear to auscultation without rales, wheezing or rhonchi  ABDOMEN: Soft, non-tender, non-distended MUSCULOSKELETAL:  Ambulates independently SKIN: Warm and dry, no edema NEUROLOGIC:  Alert and oriented x 3. No focal neuro deficits noted. PSYCHIATRIC:  Normal affect    ASSESSMENT AND PLAN: .    Persistent atrial fibrillation -generally asymptomatic. Has declined cardioversion in the past as she feels well. -CHA2DS2/VAS Stroke Risk Points= 2. Tolerating Xarelto 20 mg daily, no bleeding issues -rate controlled with metoprolol succinate 25 mg daily -EF 55-60%  Hypercholesterolemia/primary prevention -labs at PCP 01/2023: Tchol 181, HDL 70, TG 73, LDL 95 -continue rosuvastatin 10 mg daily  Cardiac risk counseling and prevention recommendations: -recommend heart healthy/Mediterranean diet, with whole grains, fruits, vegetable, fish, lean meats, nuts, and olive oil. Limit salt. -recommend moderate walking, 3-5 times/week for 30-50 minutes each session. Aim for at least 150 minutes.week. Goal should be pace of 3 miles/hours, or walking 1.5 miles in 30 minutes -recommend avoidance of tobacco products. Avoid excess alcohol.  Dispo: Follow-up in 12 months, or sooner as needed.  Signed, Jodelle Red, MD

## 2023-06-12 ENCOUNTER — Other Ambulatory Visit (HOSPITAL_BASED_OUTPATIENT_CLINIC_OR_DEPARTMENT_OTHER): Payer: Self-pay | Admitting: Family Medicine

## 2023-06-12 DIAGNOSIS — N289 Disorder of kidney and ureter, unspecified: Secondary | ICD-10-CM

## 2023-06-13 ENCOUNTER — Ambulatory Visit (HOSPITAL_COMMUNITY)
Admission: RE | Admit: 2023-06-13 | Discharge: 2023-06-13 | Disposition: A | Discharge status: Home / Self Care | Source: Ambulatory Visit | Attending: Family Medicine | Admitting: Family Medicine

## 2023-06-13 DIAGNOSIS — I10 Essential (primary) hypertension: Secondary | ICD-10-CM | POA: Diagnosis not present

## 2023-06-13 DIAGNOSIS — N289 Disorder of kidney and ureter, unspecified: Secondary | ICD-10-CM | POA: Diagnosis not present

## 2023-10-07 ENCOUNTER — Encounter (HOSPITAL_BASED_OUTPATIENT_CLINIC_OR_DEPARTMENT_OTHER): Payer: Self-pay

## 2023-10-07 LAB — LAB REPORT - SCANNED
EGFR: 54
TSH: 2.57 (ref 0.41–5.90)

## 2023-10-08 LAB — LAB REPORT - SCANNED

## 2023-11-03 NOTE — Telephone Encounter (Signed)
 Creatinine clearance with original cockcroft-gault 51 mL/min and when modified for weight 42 mL/min, but that is based on last clinic weight which isn't super recent. Didn't see labs from PCP in [facility] nor in [facility] so not sure where they are. I've CC'd Ivy to ask her to fax to PCP to get most recent BMET and CBC.   Billi - will you call PCP to get most recent BMET, CBC please?) Reche GORMAN Finder, NP

## 2023-11-04 NOTE — Telephone Encounter (Signed)
 Labs from PCP 10/07/23: A1c 6.1, creatinine 1.07, GFR 54. Just FYI.  Khalie Wince S Blessen Kimbrough, NP

## 2023-11-04 NOTE — Telephone Encounter (Signed)
 Returned a call back to the PCP office.  They will be faxing over lab results from Sept to [phone] ATTN: Reche Finder, NP here shortly.   Pt aware of plan.  Will make Reche Finder, NP and [facility] aware of this and to expect results in onbase here soon.

## 2023-11-04 NOTE — Telephone Encounter (Signed)
 Thoughts on transition from Xarelto to Eliquis? I was helping in triage and haven't seen her as a pt so don't want to make that call having not met her. She reported subconjuntival hemorrage about a month ago, no recurrence. Her creatinine clearance is very close to cutoff for reducing dose of Xarelto.   Thanks, Reche GORMAN Finder, NP

## 2023-12-05 MED ORDER — APIXABAN 5 MG PO TABS: 5.0000 mg | ORAL_TABLET | Freq: Two times a day (BID) | ORAL | 5 refills | Status: AC
